# Patient Record
Sex: Female | Born: 1955 | Race: Black or African American | Hispanic: No | Marital: Single | Smoking: Former smoker
Health system: Southern US, Community
[De-identification: ages and names within clinical notes are randomized; demographics above are authoritative.]

## PROBLEM LIST (undated history)

## (undated) DIAGNOSIS — E785 Hyperlipidemia, unspecified: Secondary | ICD-10-CM

## (undated) HISTORY — PX: ABDOMINAL HYSTERECTOMY: SHX81

---

## 2020-11-13 ENCOUNTER — Encounter (HOSPITAL_COMMUNITY): Payer: Self-pay | Admitting: Internal Medicine

## 2020-11-13 ENCOUNTER — Inpatient Hospital Stay (HOSPITAL_COMMUNITY)
Admission: EM | Admit: 2020-11-13 | Discharge: 2020-11-15 | DRG: 390 | Disposition: A | Discharge status: Home / Self Care | Payer: Medicare Other | Attending: Hospitalist | Admitting: Hospitalist

## 2020-11-13 ENCOUNTER — Emergency Department (HOSPITAL_COMMUNITY): Payer: Medicare Other

## 2020-11-13 ENCOUNTER — Other Ambulatory Visit: Payer: Self-pay

## 2020-11-13 ENCOUNTER — Inpatient Hospital Stay (HOSPITAL_COMMUNITY): Payer: Medicare Other

## 2020-11-13 DIAGNOSIS — Z87891 Personal history of nicotine dependence: Secondary | ICD-10-CM

## 2020-11-13 DIAGNOSIS — I700 Atherosclerosis of aorta: Secondary | ICD-10-CM | POA: Diagnosis present

## 2020-11-13 DIAGNOSIS — Z23 Encounter for immunization: Secondary | ICD-10-CM

## 2020-11-13 DIAGNOSIS — D72829 Elevated white blood cell count, unspecified: Secondary | ICD-10-CM | POA: Diagnosis present

## 2020-11-13 DIAGNOSIS — K5730 Diverticulosis of large intestine without perforation or abscess without bleeding: Secondary | ICD-10-CM | POA: Diagnosis present

## 2020-11-13 DIAGNOSIS — E785 Hyperlipidemia, unspecified: Secondary | ICD-10-CM | POA: Diagnosis present

## 2020-11-13 DIAGNOSIS — N289 Disorder of kidney and ureter, unspecified: Secondary | ICD-10-CM | POA: Diagnosis not present

## 2020-11-13 DIAGNOSIS — I2510 Atherosclerotic heart disease of native coronary artery without angina pectoris: Secondary | ICD-10-CM | POA: Diagnosis present

## 2020-11-13 DIAGNOSIS — K56609 Unspecified intestinal obstruction, unspecified as to partial versus complete obstruction: Secondary | ICD-10-CM | POA: Diagnosis not present

## 2020-11-13 DIAGNOSIS — K565 Intestinal adhesions [bands], unspecified as to partial versus complete obstruction: Principal | ICD-10-CM | POA: Diagnosis present

## 2020-11-13 DIAGNOSIS — Z803 Family history of malignant neoplasm of breast: Secondary | ICD-10-CM

## 2020-11-13 DIAGNOSIS — Z801 Family history of malignant neoplasm of trachea, bronchus and lung: Secondary | ICD-10-CM | POA: Diagnosis not present

## 2020-11-13 DIAGNOSIS — Z79899 Other long term (current) drug therapy: Secondary | ICD-10-CM

## 2020-11-13 DIAGNOSIS — Z0189 Encounter for other specified special examinations: Secondary | ICD-10-CM

## 2020-11-13 DIAGNOSIS — R109 Unspecified abdominal pain: Secondary | ICD-10-CM | POA: Diagnosis present

## 2020-11-13 DIAGNOSIS — Z7982 Long term (current) use of aspirin: Secondary | ICD-10-CM | POA: Diagnosis not present

## 2020-11-13 DIAGNOSIS — Z800 Family history of malignant neoplasm of digestive organs: Secondary | ICD-10-CM

## 2020-11-13 DIAGNOSIS — I493 Ventricular premature depolarization: Secondary | ICD-10-CM | POA: Diagnosis present

## 2020-11-13 DIAGNOSIS — Z20822 Contact with and (suspected) exposure to covid-19: Secondary | ICD-10-CM | POA: Diagnosis present

## 2020-11-13 DIAGNOSIS — R1013 Epigastric pain: Secondary | ICD-10-CM | POA: Diagnosis present

## 2020-11-13 DIAGNOSIS — Z978 Presence of other specified devices: Secondary | ICD-10-CM

## 2020-11-13 HISTORY — DX: Hyperlipidemia, unspecified: E78.5

## 2020-11-13 LAB — COMPREHENSIVE METABOLIC PANEL
ALT: 25 U/L (ref 0–44)
AST: 29 U/L (ref 15–41)
Albumin: 3.6 g/dL (ref 3.5–5.0)
Alkaline Phosphatase: 95 U/L (ref 38–126)
Anion gap: 8 (ref 5–15)
BUN: 12 mg/dL (ref 8–23)
CO2: 24 mmol/L (ref 22–32)
Calcium: 9.4 mg/dL (ref 8.9–10.3)
Chloride: 105 mmol/L (ref 98–111)
Creatinine, Ser: 1.01 mg/dL — ABNORMAL HIGH (ref 0.44–1.00)
GFR, Estimated: >60 mL/min (ref >60)
Glucose, Bld: 130 mg/dL — ABNORMAL HIGH (ref 70–99)
Potassium: 4 mmol/L (ref 3.5–5.1)
Sodium: 137 mmol/L (ref 135–145)
Total Bilirubin: 0.6 mg/dL (ref 0.3–1.2)
Total Protein: 6.8 g/dL (ref 6.5–8.1)

## 2020-11-13 LAB — CBC WITH DIFFERENTIAL/PLATELET
Abs Immature Granulocytes: 0.04 10*3/uL (ref 0.00–0.07)
Basophils Absolute: 0 10*3/uL (ref 0.0–0.1)
Basophils Relative: 0 %
Eosinophils Absolute: 0 10*3/uL (ref 0.0–0.5)
Eosinophils Relative: 0 %
HCT: 45.5 % (ref 36.0–46.0)
Hemoglobin: 14.7 g/dL (ref 12.0–15.0)
Immature Granulocytes: 0 %
Lymphocytes Relative: 5 %
Lymphs Abs: 0.7 10*3/uL (ref 0.7–4.0)
MCH: 27.2 pg (ref 26.0–34.0)
MCHC: 32.3 g/dL (ref 30.0–36.0)
MCV: 84.3 fL (ref 80.0–100.0)
Monocytes Absolute: 0.6 10*3/uL (ref 0.1–1.0)
Monocytes Relative: 5 %
Neutro Abs: 11.2 10*3/uL — ABNORMAL HIGH (ref 1.7–7.7)
Neutrophils Relative %: 90 %
Platelets: 254 10*3/uL (ref 150–400)
RBC: 5.4 MIL/uL — ABNORMAL HIGH (ref 3.87–5.11)
RDW: 14.8 % (ref 11.5–15.5)
WBC: 12.6 10*3/uL — ABNORMAL HIGH (ref 4.0–10.5)
nRBC: 0 % (ref 0.0–0.2)

## 2020-11-13 LAB — LIPASE, BLOOD: Lipase: 32 U/L (ref 11–51)

## 2020-11-13 LAB — LACTIC ACID, PLASMA: Lactic Acid, Venous: 1.6 mmol/L (ref 0.5–1.9)

## 2020-11-13 LAB — SARS CORONAVIRUS 2 (TAT 6-24 HRS): SARS Coronavirus 2: NEGATIVE

## 2020-11-13 MED ORDER — ENOXAPARIN SODIUM 40 MG/0.4ML IJ SOSY
40.0000 mg | PREFILLED_SYRINGE | Freq: Every day | INTRAMUSCULAR | Status: DC
  Administered 2020-11-13 – 2020-11-15 (×3): 40 mg via SUBCUTANEOUS
  Filled 2020-11-13 (×3): qty 0.4

## 2020-11-13 MED ORDER — ONDANSETRON HCL 4 MG/2ML IJ SOLN: 4.0000 mg | Freq: Four times a day (QID) | INTRAMUSCULAR | Status: DC | PRN

## 2020-11-13 MED ORDER — SODIUM CHLORIDE 0.9 % IV SOLN: INTRAVENOUS | Status: DC

## 2020-11-13 MED ORDER — DIATRIZOATE MEGLUMINE & SODIUM 66-10 % PO SOLN
90.0000 mL | Freq: Once | ORAL | Status: AC
  Administered 2020-11-13: 90 mL via NASOGASTRIC
  Filled 2020-11-13: qty 90

## 2020-11-13 MED ORDER — MAGNESIUM SULFATE IN D5W 1-5 GM/100ML-% IV SOLN
1.0000 g | Freq: Once | INTRAVENOUS | Status: AC
  Administered 2020-11-13: 1 g via INTRAVENOUS
  Filled 2020-11-13: qty 100

## 2020-11-13 MED ORDER — ALBUTEROL SULFATE (2.5 MG/3ML) 0.083% IN NEBU: 2.5000 mg | INHALATION_SOLUTION | Freq: Four times a day (QID) | RESPIRATORY_TRACT | Status: DC | PRN

## 2020-11-13 MED ORDER — ACETAMINOPHEN 325 MG PO TABS: 650.0000 mg | ORAL_TABLET | Freq: Once | ORAL | Status: DC

## 2020-11-13 MED ORDER — ACETAMINOPHEN 325 MG PO TABS
650.0000 mg | ORAL_TABLET | Freq: Four times a day (QID) | ORAL | Status: DC | PRN
  Administered 2020-11-14: 650 mg via ORAL
  Filled 2020-11-13: qty 2

## 2020-11-13 MED ORDER — MORPHINE SULFATE (PF) 2 MG/ML IV SOLN
2.0000 mg | INTRAVENOUS | Status: DC | PRN
  Administered 2020-11-13 – 2020-11-14 (×4): 2 mg via INTRAVENOUS
  Filled 2020-11-13 (×4): qty 1

## 2020-11-13 MED ORDER — PANTOPRAZOLE SODIUM 40 MG IV SOLR
40.0000 mg | Freq: Every day | INTRAVENOUS | Status: DC
  Administered 2020-11-13 – 2020-11-15 (×3): 40 mg via INTRAVENOUS
  Filled 2020-11-13 (×3): qty 40

## 2020-11-13 MED ORDER — ONDANSETRON HCL 4 MG/2ML IJ SOLN
4.0000 mg | Freq: Once | INTRAMUSCULAR | Status: AC
Start: 2020-11-13 — End: 2020-11-13
  Administered 2020-11-13: 4 mg via INTRAVENOUS
  Filled 2020-11-13: qty 2

## 2020-11-13 MED ORDER — IOHEXOL 9 MG/ML PO SOLN
ORAL | Status: AC
  Filled 2020-11-13: qty 1000

## 2020-11-13 MED ORDER — HYDRALAZINE HCL 20 MG/ML IJ SOLN: 10.0000 mg | Freq: Four times a day (QID) | INTRAMUSCULAR | Status: DC | PRN

## 2020-11-13 MED ORDER — ONDANSETRON HCL 4 MG PO TABS: 4.0000 mg | ORAL_TABLET | Freq: Four times a day (QID) | ORAL | Status: DC | PRN

## 2020-11-13 MED ORDER — MORPHINE SULFATE (PF) 4 MG/ML IV SOLN
4.0000 mg | Freq: Once | INTRAVENOUS | Status: AC
  Administered 2020-11-13: 4 mg via INTRAVENOUS
  Filled 2020-11-13: qty 1

## 2020-11-13 MED ORDER — IBUPROFEN 400 MG PO TABS: 400.0000 mg | ORAL_TABLET | Freq: Once | ORAL | Status: DC

## 2020-11-13 MED ORDER — ACETAMINOPHEN 650 MG RE SUPP: 650.0000 mg | Freq: Four times a day (QID) | RECTAL | Status: DC | PRN

## 2020-11-13 MED ORDER — PHENOL 1.4 % MT LIQD
1.0000 | OROMUCOSAL | Status: DC | PRN
  Filled 2020-11-13: qty 177

## 2020-11-13 NOTE — ED Provider Notes (Signed)
[facility] EMERGENCY DEPARTMENT Provider Note   CSN: [id number] Arrival date & time: 11/13/20  0049     History Chief Complaint  Patient presents with  . Abdominal Pain    Kristy Rhodes is a 65 y.o. female.  The history is provided by the patient.  Abdominal Pain She complains of epigastric pain which started this afternoon, but got much worse tonight.  Pain is severe and she rates it at 10/10.  Pain is described as sharp.  There is associated nausea but no vomiting.  Nothing makes it better, nothing makes it worse.  Pain does tend to come in waves.  She denies constipation or diarrhea.  She denies fever or chills.  She has never had pain like this before.  She does have history of prior laparoscopy, no other abdominal surgery.   No past medical history on file.  There are no problems to display for this patient.   ** The histories are not reviewed yet. Please review them in the "History" navigator section and refresh this SmartLink.   OB History   No obstetric history on file.     No family history on file.     Home Medications Prior to Admission medications   Not on File    Allergies    Patient has no allergy information on record.  Review of Systems   Review of Systems  Gastrointestinal: Positive for abdominal pain.  All other systems reviewed and are negative.   Physical Exam Updated Vital Signs BP (!) 157/95   Pulse (!) 44   Temp 98 F (36.7 C) (Oral)   Resp 18   Ht 5\' 7"  (1.702 m)   Wt 70.3 kg   SpO2 98%   BMI 24.28 kg/m   Physical Exam Vitals and nursing note reviewed.   65 year old female, resting comfortably and in no acute distress. Vital signs are significant for slow heart rate and elevated blood pressure. Oxygen saturation is 98%, which is normal. Head is normocephalic and atraumatic. PERRLA, EOMI. Oropharynx is clear. Neck is nontender and supple without adenopathy or JVD. Back is nontender and there is no CVA  tenderness. Lungs are clear without rales, wheezes, or rhonchi. Chest is nontender. Heart has regular rate and rhythm without murmur. Abdomen is soft, flat, with moderate epigastric tenderness.  There is no rebound or guarding.  There is no right upper quadrant tenderness.  There are no masses or hepatosplenomegaly and peristalsis is hypoactive. Extremities have no cyanosis or edema, full range of motion is present. Skin is warm and dry without rash. Neurologic: Mental status is normal, cranial nerves are intact, there are no motor or sensory deficits.  ED Results / Procedures / Treatments   Labs (all labs ordered are listed, but only abnormal results are displayed) Labs Reviewed  COMPREHENSIVE METABOLIC PANEL - Abnormal; Notable for the following components:      Result Value   Glucose, Bld 130 (*)    Creatinine, Ser 1.01 (*)    All other components within normal limits  CBC WITH DIFFERENTIAL/PLATELET - Abnormal; Notable for the following components:   WBC 12.6 (*)    RBC 5.40 (*)    Neutro Abs 11.2 (*)    All other components within normal limits  SARS CORONAVIRUS 2 (TAT 6-24 HRS)  LIPASE, BLOOD  URINALYSIS, ROUTINE W REFLEX MICROSCOPIC    EKG EKG Interpretation  Date/Time:  Monday Nov 13 2020 00:49:33 EDT Ventricular Rate:  81 PR Interval:  133  QRS Duration: 83 QT Interval:  388 QTC Calculation: 451 R Axis:   37 Text Interpretation: Sinus rhythm Multiple ventricular premature complexes Consider right atrial enlargement No old tracing to compare Confirmed by Dione Booze ([id number]) on 11/13/2020 1:32:29 AM   Radiology CT ABDOMEN PELVIS WO CONTRAST  Result Date: 11/13/2020 CLINICAL DATA:  65 year old female with acute abdominal pain, pain with palpation. Nausea. EXAM: CT ABDOMEN AND PELVIS WITHOUT CONTRAST TECHNIQUE: Multidetector CT imaging of the abdomen and pelvis was performed following the standard protocol without IV contrast. COMPARISON:  None. FINDINGS: Lower chest:  Calcified coronary artery atherosclerosis (series 3, image 4). No cardiomegaly or pericardial effusion. Mildly tortuous descending thoracic aorta. Geographic ground-glass opacity in the right lower lobe on series 7, image 7. This along with patchy opacity in the posterior basal segment of the left lower lobe appears more inflammatory than related to atelectasis or scarring. There is platelike atelectasis in the right middle lobe. No pleural effusion or consolidation. Hepatobiliary: Small volume of simple perihepatic free fluid. Negative noncontrast liver. Contracted gallbladder. Pancreas: Negative noncontrast appearance. Spleen: Negative. Adrenals/Urinary Tract: Negative adrenal glands. Nonobstructed kidneys. Unremarkable urinary bladder. Occasional pelvic phleboliths. No urinary calculus identified. Stomach/Bowel: Severe diverticulosis of the large bowel throughout the abdomen and pelvis. However, no convincing large bowel inflammation. Appendix remains normal on series 3, image 59. But the stomach and small bowel are distended, with multiple dilated oral contrast containing small bowel loops which continue to the level of a fairly abrupt transition point in the left lower abdomen, suprapubic series 3, image 66 and coronal image 31. Here the distal dilated small bowel contains flocculated material and abruptly transitions to inflamed but decompressed loops in the pelvis. There is mesenteric stranding in this region and to a lesser extent in the dilated small bowel mesentery throughout the abdomen. Distal most small bowel loops have a more normal appearance and no longer appear inflamed. Small volume of free fluid is associated in both gutters. No free air. The proximal duodenum is also dilated. Vascular/Lymphatic: Extensive Aortoiliac calcified atherosclerosis. Normal caliber abdominal aorta. Vascular patency is not evaluated in the absence of IV contrast. No lymphadenopathy. Reproductive: Uterus appears absent.   Diminutive or absent ovaries. Other: Small volume simple density pelvic free fluid on series 3, image 68. Musculoskeletal: Advanced lumbar facet arthropathy. Subtle anterolisthesis of L4 on L5. Generalized abnormal bone mineralization in the right iliac wing relatively sparing the acetabulum. This abuts the right SI joint. Cortex in the affected areas appears somewhat thick and there is a heterogeneous ground-glass density to the bone. No osteolysis or associated soft tissue mass. No other suspicious osseous lesion identified. IMPRESSION: 1. Small Bowel Obstruction with transition point in the suprapubic left lower abdomen. Both the dilated loops and the decompressed loops at the transition appear inflamed. And there is a small volume of free fluid throughout the abdomen and pelvis. No free air. 2. Geographic ground-glass lung opacity in the right lower lobe suspicious for viral/atypical respiratory infection, including possibly COVID-19. Segmental collapse of the right middle lobe. No pleural fluid. 3. Abnormal bone mineralization in the right iliac wing. But benign etiology such as Fibrous Dysplasia or Paget's disease over metastatic disease or malignancy. If there are no outside prior studies for comparison then recommend follow-up outpatient nuclear Medicine whole-body bone scan to further characterize the skeleton. 4. Severe diverticulosis throughout the large bowel but no large bowel inflammation. 5. Calcified coronary artery and Aortic Atherosclerosis (ICD10-I70.0). Electronically Signed   By: Althea Grimmer.D.  On: 11/13/2020 04:52    Procedures Procedures   Medications Ordered in ED Medications  morphine 4 MG/ML injection 4 mg (has no administration in time range)  ondansetron (ZOFRAN) injection 4 mg (has no administration in time range)    ED Course  I have reviewed the triage vital signs and the nursing notes.  Pertinent labs & imaging results that were available during my care of the patient  were reviewed by me and considered in my medical decision making (see chart for details).   MDM Rules/Calculators/A&P                         Upper abdominal pain of uncertain cause.  Consider diverticulitis, peptic ulcer disease, cholecystitis, pancreatitis.  ECG shows no acute changes.  Screening labs have been ordered as well as CT of abdomen and pelvis.  She will be given morphine for pain and ondansetron for nausea.  She has no prior records in the [facility] system or in Care Everywhere.  CT scan shows small bowel obstruction.  This is presumably secondary to adhesions from her prior laparoscopy.  She got good relief of pain with morphine, but did request an additional dose which was given.  Case is discussed with Dr. Katrinka Blazing of [facility], who agrees to admit the patient.  Final Clinical Impression(s) / ED Diagnoses Final diagnoses:  Small bowel obstruction [facility])    Rx / DC Orders ED Discharge Orders    None       Dione Booze, MD 11/13/20 [phone]

## 2020-11-13 NOTE — Consult Note (Signed)
Reason for Consult:sbo Referring Physician: glick  Kristy Rhodes is an 65 y.o. female.  HPI: 70 yof who is otherwise healthy, history of prior hysterectomy and lsc, presents after having onset of abdominal pain yesterday that has worsened.  No flatus or bm since Saturday.  N/v at home and in er.  No fevers. Never happened before, came in as nothing at home was making it better.  She had ct scan that shows sbo and I was asked to see her.   pmh none psh hysterectomy, gyn lsc  Social History:  has no history on file for tobacco use, alcohol use, and drug use.  Allergies: No Known Allergies  Medications: I have reviewed the patient's current medications.  Results for orders placed or performed during the hospital encounter of 11/13/20 (from the past 48 hour(s))  Comprehensive metabolic panel     Status: Abnormal   Collection Time: 11/13/20  3:41 AM  Result Value Ref Range   Sodium 137 135 - 145 mmol/L   Potassium 4.0 3.5 - 5.1 mmol/L   Chloride 105 98 - 111 mmol/L   CO2 24 22 - 32 mmol/L   Glucose, Bld 130 (H) 70 - 99 mg/dL    Comment: Glucose reference range applies only to samples taken after fasting for at least 8 hours.   BUN 12 8 - 23 mg/dL   Creatinine, Ser 6.94 (H) 0.44 - 1.00 mg/dL   Calcium 9.4 8.9 - 85.4 mg/dL   Total Protein 6.8 6.5 - 8.1 g/dL   Albumin 3.6 3.5 - 5.0 g/dL   AST 29 15 - 41 U/L   ALT 25 0 - 44 U/L   Alkaline Phosphatase 95 38 - 126 U/L   Total Bilirubin 0.6 0.3 - 1.2 mg/dL   GFR, Estimated >62 >70 mL/min    Comment: (NOTE) Calculated using the CKD-EPI Creatinine Equation (2021)    Anion gap 8 5 - 15    Comment: Performed at [facility], [address], [place], [place] [address]  Lipase, blood     Status: None   Collection Time: 11/13/20  3:41 AM  Result Value Ref Range   Lipase 32 11 - 51 U/L    Comment: Performed at [facility], [address], [place], [place] [address]  CBC with Differential     Status: Abnormal   Collection Time:  11/13/20  3:41 AM  Result Value Ref Range   WBC 12.6 (H) 4.0 - 10.5 K/uL   RBC 5.40 (H) 3.87 - 5.11 MIL/uL   Hemoglobin 14.7 12.0 - 15.0 g/dL   HCT 99.3 71.6 - 96.7 %   MCV 84.3 80.0 - 100.0 fL   MCH 27.2 26.0 - 34.0 pg   MCHC 32.3 30.0 - 36.0 g/dL   RDW 89.3 81.0 - 17.5 %   Platelets 254 150 - 400 K/uL   nRBC 0.0 0.0 - 0.2 %   Neutrophils Relative % 90 %   Neutro Abs 11.2 (H) 1.7 - 7.7 K/uL   Lymphocytes Relative 5 %   Lymphs Abs 0.7 0.7 - 4.0 K/uL   Monocytes Relative 5 %   Monocytes Absolute 0.6 0.1 - 1.0 K/uL   Eosinophils Relative 0 %   Eosinophils Absolute 0.0 0.0 - 0.5 K/uL   Basophils Relative 0 %   Basophils Absolute 0.0 0.0 - 0.1 K/uL   Immature Granulocytes 0 %   Abs Immature Granulocytes 0.04 0.00 - 0.07 K/uL    Comment: Performed at [facility], [address], [place], [place] [address]    CT ABDOMEN PELVIS WO CONTRAST  Result Date: 11/13/2020 CLINICAL DATA:  65 year old female with acute abdominal pain, pain with palpation. Nausea. EXAM: CT ABDOMEN AND PELVIS WITHOUT CONTRAST TECHNIQUE: Multidetector CT imaging of the abdomen and pelvis was performed following the standard protocol without IV contrast. COMPARISON:  None. FINDINGS: Lower chest: Calcified coronary artery atherosclerosis (series 3, image 4). No cardiomegaly or pericardial effusion. Mildly tortuous descending thoracic aorta. Geographic ground-glass opacity in the right lower lobe on series 7, image 7. This along with patchy opacity in the posterior basal segment of the left lower lobe appears more inflammatory than related to atelectasis or scarring. There is platelike atelectasis in the right middle lobe. No pleural effusion or consolidation. Hepatobiliary: Small volume of simple perihepatic free fluid. Negative noncontrast liver. Contracted gallbladder. Pancreas: Negative noncontrast appearance. Spleen: Negative. Adrenals/Urinary Tract: Negative adrenal glands. Nonobstructed kidneys. Unremarkable  urinary bladder. Occasional pelvic phleboliths. No urinary calculus identified. Stomach/Bowel: Severe diverticulosis of the large bowel throughout the abdomen and pelvis. However, no convincing large bowel inflammation. Appendix remains normal on series 3, image 59. But the stomach and small bowel are distended, with multiple dilated oral contrast containing small bowel loops which continue to the level of a fairly abrupt transition point in the left lower abdomen, suprapubic series 3, image 66 and coronal image 31. Here the distal dilated small bowel contains flocculated material and abruptly transitions to inflamed but decompressed loops in the pelvis. There is mesenteric stranding in this region and to a lesser extent in the dilated small bowel mesentery throughout the abdomen. Distal most small bowel loops have a more normal appearance and no longer appear inflamed. Small volume of free fluid is associated in both gutters. No free air. The proximal duodenum is also dilated. Vascular/Lymphatic: Extensive Aortoiliac calcified atherosclerosis. Normal caliber abdominal aorta. Vascular patency is not evaluated in the absence of IV contrast. No lymphadenopathy. Reproductive: Uterus appears absent.  Diminutive or absent ovaries. Other: Small volume simple density pelvic free fluid on series 3, image 68. Musculoskeletal: Advanced lumbar facet arthropathy. Subtle anterolisthesis of L4 on L5. Generalized abnormal bone mineralization in the right iliac wing relatively sparing the acetabulum. This abuts the right SI joint. Cortex in the affected areas appears somewhat thick and there is a heterogeneous ground-glass density to the bone. No osteolysis or associated soft tissue mass. No other suspicious osseous lesion identified. IMPRESSION: 1. Small Bowel Obstruction with transition point in the suprapubic left lower abdomen. Both the dilated loops and the decompressed loops at the transition appear inflamed. And there is a  small volume of free fluid throughout the abdomen and pelvis. No free air. 2. Geographic ground-glass lung opacity in the right lower lobe suspicious for viral/atypical respiratory infection, including possibly COVID-19. Segmental collapse of the right middle lobe. No pleural fluid. 3. Abnormal bone mineralization in the right iliac wing. But benign etiology such as Fibrous Dysplasia or Paget's disease over metastatic disease or malignancy. If there are no outside prior studies for comparison then recommend follow-up outpatient nuclear Medicine whole-body bone scan to further characterize the skeleton. 4. Severe diverticulosis throughout the large bowel but no large bowel inflammation. 5. Calcified coronary artery and Aortic Atherosclerosis (ICD10-I70.0). Electronically Signed   By: Odessa Fleming M.D.   On: 11/13/2020 04:52    Review of Systems  Constitutional: Positive for fatigue. Negative for fever.  Respiratory: Negative for cough and shortness of breath.   Gastrointestinal:  Positive for abdominal pain, constipation, nausea and vomiting.  Genitourinary: Negative for difficulty urinating.  All other systems reviewed and are negative.  Blood pressure 114/76, pulse 70, temperature 98 F (36.7 C), temperature source Oral, resp. rate 14, height 5\' 7"  (1.702 m), weight 70.3 kg, SpO2 96 %. Physical Exam Constitutional:      General: She is not in acute distress.    Appearance: She is well-developed.  Eyes:     General: No scleral icterus.    Extraocular Movements: Extraocular movements intact.  Cardiovascular:     Rate and Rhythm: Normal rate and regular rhythm.     Heart sounds: Normal heart sounds.  Pulmonary:     Effort: Pulmonary effort is normal.     Breath sounds: Normal breath sounds.  Abdominal:     General: A surgical scar is present. There is distension (mild).     Tenderness: There is abdominal tenderness (mild periumbo and epigastric, no peritoneal signs).     Hernia: No hernia is  present.  Skin:    General: Skin is warm and dry.     Capillary Refill: Capillary refill takes less than 2 seconds.  Neurological:     General: No focal deficit present.     Mental Status: She is alert.  Psychiatric:        Mood and Affect: Mood normal.        Behavior: Behavior normal.     Assessment/Plan: SBO -appears has sbo due to adhesions -no current indication for surgery-ct does have some stranding associated and small amt free fluid but exam is very benign -will check baseline lactate -plan for sbo protocol with ng tube which obviously is necessary in her case given ct scan and exam to attempt to resolve this conservatively and do protocol -will re-evaluate later today  11/13/2020, 9:02 AM

## 2020-11-13 NOTE — ED Triage Notes (Signed)
BIB [facility] from home. Pt started having abdominal pain about 1 hour PTA. Abdominal swelling and pain with palpitation. Pt had hypertension with no history. Reports nausea and regular BMs.   200/120 70 heart rate  97%  CBG 166

## 2020-11-13 NOTE — Progress Notes (Signed)
Pt arrived to unit from ED. Peggy RN gave report. Pt's NG tube intact and connected to wall suction. Pt's niece Bonita Quin present with pt and is taking pt's belongings home with her.

## 2020-11-13 NOTE — H&P (Addendum)
History and Physical    Kristy Rhodes WUJ:[id number] DOB: [birth year] DOA: 11/13/2020  Referring MD/NP/PA: Dione Boozeavid Glick, MD PCP: Caffie DammeSmith, Karla, MD  Patient coming from: home via EMS  Chief Complaint: Abdominal pain  I have personally briefly reviewed patient's old medical records in [facility]   HPI: Kristy LefortSandra Rhodes is a 65 y.o. female with medical history significant of hyperlipidemia and remote tobacco presents with complaints of abdominal pain which started yesterday evening after she had eaten dinner.  Pain was described as severe 10/10 and located in the middle of her stomach.  Patient reported having nausea without vomiting.  Last bowel movement was yesterday.  Since coming into the hospital she has not had a bowel movement or been able to pass flatus.  Associated symptoms included  palpitations.  She felt as though she could possibly pass out and therefore called EMS.  Denies any chest pain, shortness of breath, cough, fever, chills, or dysuria.  She reports having a hysterectomy over 20 years ago.  She last had a colonoscopy the beginning of this year or last year.  ED Course: Upon admission into the emergency department patient was seen to be afebrile, blood pressures 114/76-165/98, and all other vital signs maintain.  Labs significant for WBC 12.6, BUN 12, and creatinine 1.01.  Other significant for small bowel obstruction with transition point in the suprapubic left lower abdomen, geographic groundglass lung opacity in the right lower lobe suspicious for viral/atypical respiratory infection, abnormal bone mineralization of the right iliac wing, severe diverticulosis, and calcified coronary artery with aortic atherosclerosis.  COVID-19 screening was pending.  Patient had been given morphine and Zofran.  Review of Systems  Constitutional: Positive for malaise/fatigue. Negative for fever.  HENT: Negative for congestion and nosebleeds.   Eyes: Negative for double vision and photophobia.   Respiratory: Negative for cough and shortness of breath.   Cardiovascular: Positive for palpitations. Negative for chest pain.  Gastrointestinal: Positive for abdominal pain and nausea. Negative for vomiting.  Genitourinary: Negative for dysuria and hematuria.  Musculoskeletal: Negative for falls and joint pain.  Skin: Negative for itching and rash.  Neurological: Negative for focal weakness and loss of consciousness.  Psychiatric/Behavioral: Negative for memory loss and substance abuse.    Past Medical History:  Diagnosis Date  . Hyperlipidemia     Past Surgical History:  Procedure Laterality Date  . ABDOMINAL HYSTERECTOMY       reports that she has quit smoking. She has never used smokeless tobacco. She reports previous alcohol use. She reports that she does not use drugs.  No Known Allergies  Family History  Problem Relation Age of Onset  . Breast cancer Mother   . Rectal cancer Father   . Lung cancer Sister      Current Outpatient Medications on File Prior to Encounter  Medication Sig Dispense Refill  . aspirin EC 81 MG tablet Take 81 mg by mouth daily. Swallow whole.    Marland Kitchen. atorvastatin (LIPITOR) 40 MG tablet Take 40 mg by mouth at bedtime.        Physical Exam:  Constitutional: Elderly female currently in no acute distress Vitals:   11/13/20 0600 11/13/20 0615 11/13/20 0630 11/13/20 0645  BP: 126/75 125/76 119/66 114/76  Pulse: 68  70   Resp: 14 14 14 14   Temp:      TempSrc:      SpO2: 93%  96%   Weight:      Height:       Eyes: PERRL,  lids and conjunctivae normal ENMT: Mucous membranes are moist. Posterior pharynx clear of any exudate or lesions.nasogastric tube in place. Neck: normal, supple, no masses, no thyromegaly Respiratory: clear to auscultation bilaterally, no wheezing, no crackles. Normal respiratory effort. No accessory muscle use.  Cardiovascular: Regular rate and rhythm, no murmurs / rubs / gallops. No extremity edema. 2+ pedal pulses. No  carotid bruits.  Abdomen: Mild distention with tenderness to palpation midline  and bowel sounds decreased. Musculoskeletal: no clubbing / cyanosis. No joint deformity upper and lower extremities. Good ROM, no contractures. Normal muscle tone.  Skin: no rashes, lesions, ulcers.  Healed surgical scar. Neurologic: CN 2-12 grossly intact. Sensation intact, DTR normal. Strength 5/5 in all 4.  Psychiatric: Normal judgment and insight. Alert and oriented x 3. Normal mood.     Labs on Admission: I have personally reviewed following labs and imaging studies  CBC: Recent Labs  Lab 11/13/20 0341  WBC 12.6*  NEUTROABS 11.2*  HGB 14.7  HCT 45.5  MCV 84.3  PLT 254   Basic Metabolic Panel: Recent Labs  Lab 11/13/20 0341  NA 137  K 4.0  CL 105  CO2 24  GLUCOSE 130*  BUN 12  CREATININE 1.01*  CALCIUM 9.4   GFR: Estimated Creatinine Clearance: 53.3 mL/min (A) (by C-G formula based on SCr of 1.01 mg/dL (H)). Liver Function Tests: Recent Labs  Lab 11/13/20 0341  AST 29  ALT 25  ALKPHOS 95  BILITOT 0.6  PROT 6.8  ALBUMIN 3.6   Recent Labs  Lab 11/13/20 0341  LIPASE 32   No results for input(s): AMMONIA in the last 168 hours. Coagulation Profile: No results for input(s): INR, PROTIME in the last 168 hours. Cardiac Enzymes: No results for input(s): CKTOTAL, CKMB, CKMBINDEX, TROPONINI in the last 168 hours. BNP (last 3 results) No results for input(s): PROBNP in the last 8760 hours. HbA1C: No results for input(s): HGBA1C in the last 72 hours. CBG: No results for input(s): GLUCAP in the last 168 hours. Lipid Profile: No results for input(s): CHOL, HDL, LDLCALC, TRIG, CHOLHDL, LDLDIRECT in the last 72 hours. Thyroid Function Tests: No results for input(s): TSH, T4TOTAL, FREET4, T3FREE, THYROIDAB in the last 72 hours. Anemia Panel: No results for input(s): VITAMINB12, FOLATE, FERRITIN, TIBC, IRON, RETICCTPCT in the last 72 hours. Urine analysis: No results found for:  COLORURINE, APPEARANCEUR, LABSPEC, PHURINE, GLUCOSEU, HGBUR, BILIRUBINUR, KETONESUR, PROTEINUR, UROBILINOGEN, NITRITE, LEUKOCYTESUR Sepsis Labs: No results found for this or any previous visit (from the past 240 hour(s)).   Radiological Exams on Admission: CT ABDOMEN PELVIS WO CONTRAST  Result Date: 11/13/2020 CLINICAL DATA:  65 year old female with acute abdominal pain, pain with palpation. Nausea. EXAM: CT ABDOMEN AND PELVIS WITHOUT CONTRAST TECHNIQUE: Multidetector CT imaging of the abdomen and pelvis was performed following the standard protocol without IV contrast. COMPARISON:  None. FINDINGS: Lower chest: Calcified coronary artery atherosclerosis (series 3, image 4). No cardiomegaly or pericardial effusion. Mildly tortuous descending thoracic aorta. Geographic ground-glass opacity in the right lower lobe on series 7, image 7. This along with patchy opacity in the posterior basal segment of the left lower lobe appears more inflammatory than related to atelectasis or scarring. There is platelike atelectasis in the right middle lobe. No pleural effusion or consolidation. Hepatobiliary: Small volume of simple perihepatic free fluid. Negative noncontrast liver. Contracted gallbladder. Pancreas: Negative noncontrast appearance. Spleen: Negative. Adrenals/Urinary Tract: Negative adrenal glands. Nonobstructed kidneys. Unremarkable urinary bladder. Occasional pelvic phleboliths. No urinary calculus identified. Stomach/Bowel: Severe diverticulosis of the  large bowel throughout the abdomen and pelvis. However, no convincing large bowel inflammation. Appendix remains normal on series 3, image 59. But the stomach and small bowel are distended, with multiple dilated oral contrast containing small bowel loops which continue to the level of a fairly abrupt transition point in the left lower abdomen, suprapubic series 3, image 66 and coronal image 31. Here the distal dilated small bowel contains flocculated material and  abruptly transitions to inflamed but decompressed loops in the pelvis. There is mesenteric stranding in this region and to a lesser extent in the dilated small bowel mesentery throughout the abdomen. Distal most small bowel loops have a more normal appearance and no longer appear inflamed. Small volume of free fluid is associated in both gutters. No free air. The proximal duodenum is also dilated. Vascular/Lymphatic: Extensive Aortoiliac calcified atherosclerosis. Normal caliber abdominal aorta. Vascular patency is not evaluated in the absence of IV contrast. No lymphadenopathy. Reproductive: Uterus appears absent.  Diminutive or absent ovaries. Other: Small volume simple density pelvic free fluid on series 3, image 68. Musculoskeletal: Advanced lumbar facet arthropathy. Subtle anterolisthesis of L4 on L5. Generalized abnormal bone mineralization in the right iliac wing relatively sparing the acetabulum. This abuts the right SI joint. Cortex in the affected areas appears somewhat thick and there is a heterogeneous ground-glass density to the bone. No osteolysis or associated soft tissue mass. No other suspicious osseous lesion identified. IMPRESSION: 1. Small Bowel Obstruction with transition point in the suprapubic left lower abdomen. Both the dilated loops and the decompressed loops at the transition appear inflamed. And there is a small volume of free fluid throughout the abdomen and pelvis. No free air. 2. Geographic ground-glass lung opacity in the right lower lobe suspicious for viral/atypical respiratory infection, including possibly COVID-19. Segmental collapse of the right middle lobe. No pleural fluid. 3. Abnormal bone mineralization in the right iliac wing. But benign etiology such as Fibrous Dysplasia or Paget's disease over metastatic disease or malignancy. If there are no outside prior studies for comparison then recommend follow-up outpatient nuclear Medicine whole-body bone scan to further  characterize the skeleton. 4. Severe diverticulosis throughout the large bowel but no large bowel inflammation. 5. Calcified coronary artery and Aortic Atherosclerosis (ICD10-I70.0). Electronically Signed   By: Odessa Fleming M.D.   On: 11/13/2020 04:52    EKG: Independently reviewed.  Sinus rhythm 81 bpm with multiple PVCs  Assessment/Plan Abdominal pain secondary to small bowel obstruction: Acute.  Patient presents with complaints of abdominal pain with nausea starting yesterday evening.  CT scan of the abdomen and pelvis significant for a small bowel obstruction with transition point in the suprapubic left lower abdomen.  Patient reports prior history of open hysterectomy. -Admit to medical telemetry bed -N.p.o. -Strict intake and output -Antiemetics as needed -NGT to suction -Abdominal x-ray daily -Morphine as needed for pain -Normal saline IV fluids at 75 mL/h -Appreciate general surgery consultative services we will follow for any further recommendation  Leukocytosis: Acute.  WBC elevated at 12.6.  Suspect reactive to above. -Continue to monitor   Hyperlipidemia -Held atorvastatin due to small bowel obstruction  PVCs: On admission patient was noted to have multiple PVCs. -Follow-up telemetry  Incidental findings: Groundglass opacity of the right lower lobe(COVID-19 screening was negative and patient denies any complaints of cough), abnormal bone mineralization of the right iliac wing, severe diverticulosis, and calcified coronary artery atherosclerosis. -Recommend outpatient follow-up for further work-up as needed   GI prophylaxis: Protonix IV DVT prophylaxis: Lovenox  Code Status: Full Family Communication: Niece updated over the phone Disposition Plan: Likely discharge home once medically stable Consults called: General surgery Admission status: Inpatient, require more than 2 midnight stay due to monitoring for resolution of small bowel obstruction  Clydie Braun MD [facility]   If 7PM-7AM, please contact night-coverage   11/13/2020, 7:13 AM

## 2020-11-13 NOTE — ED Notes (Signed)
Pt vomited large about yellowish liquid. Zofran given

## 2020-11-13 NOTE — ED Notes (Signed)
Called xray for abd xray for NG tube verification

## 2020-11-13 NOTE — ED Notes (Signed)
I wasn't able to get patient blood. 

## 2020-11-13 NOTE — ED Notes (Signed)
Attempted to obtain lactic acid and HIV tubes. Attempt unsuccessful.

## 2020-11-14 ENCOUNTER — Inpatient Hospital Stay (HOSPITAL_COMMUNITY): Payer: Medicare Other

## 2020-11-14 DIAGNOSIS — E785 Hyperlipidemia, unspecified: Secondary | ICD-10-CM | POA: Diagnosis not present

## 2020-11-14 DIAGNOSIS — R109 Unspecified abdominal pain: Secondary | ICD-10-CM | POA: Diagnosis not present

## 2020-11-14 DIAGNOSIS — K56609 Unspecified intestinal obstruction, unspecified as to partial versus complete obstruction: Secondary | ICD-10-CM | POA: Diagnosis not present

## 2020-11-14 DIAGNOSIS — N289 Disorder of kidney and ureter, unspecified: Secondary | ICD-10-CM

## 2020-11-14 DIAGNOSIS — D72829 Elevated white blood cell count, unspecified: Secondary | ICD-10-CM | POA: Diagnosis not present

## 2020-11-14 LAB — BASIC METABOLIC PANEL
Anion gap: 9 (ref 5–15)
BUN: 14 mg/dL (ref 8–23)
CO2: 26 mmol/L (ref 22–32)
Calcium: 9.1 mg/dL (ref 8.9–10.3)
Chloride: 109 mmol/L (ref 98–111)
Creatinine, Ser: 1.1 mg/dL — ABNORMAL HIGH (ref 0.44–1.00)
GFR, Estimated: 55 mL/min — ABNORMAL LOW (ref >60)
Glucose, Bld: 88 mg/dL (ref 70–99)
Potassium: 3.9 mmol/L (ref 3.5–5.1)
Sodium: 144 mmol/L (ref 135–145)

## 2020-11-14 LAB — HIV ANTIBODY (ROUTINE TESTING W REFLEX): HIV Screen 4th Generation wRfx: NONREACTIVE

## 2020-11-14 LAB — CBC
HCT: 45.8 % (ref 36.0–46.0)
Hemoglobin: 14.6 g/dL (ref 12.0–15.0)
MCH: 27.7 pg (ref 26.0–34.0)
MCHC: 31.9 g/dL (ref 30.0–36.0)
MCV: 86.7 fL (ref 80.0–100.0)
Platelets: 230 10*3/uL (ref 150–400)
RBC: 5.28 MIL/uL — ABNORMAL HIGH (ref 3.87–5.11)
RDW: 15.4 % (ref 11.5–15.5)
WBC: 8.3 10*3/uL (ref 4.0–10.5)
nRBC: 0 % (ref 0.0–0.2)

## 2020-11-14 LAB — MAGNESIUM: Magnesium: 2.2 mg/dL (ref 1.7–2.4)

## 2020-11-14 MED ORDER — PNEUMOCOCCAL VAC POLYVALENT 25 MCG/0.5ML IJ INJ
0.5000 mL | INJECTION | INTRAMUSCULAR | Status: AC
  Administered 2020-11-15: 0.5 mL via INTRAMUSCULAR
  Filled 2020-11-14: qty 0.5

## 2020-11-14 NOTE — Progress Notes (Addendum)
Subjective: Patient feeling better today.  No flatus or BM yet, but abdominal pain essentially resolved and doesn't feel bloated.  ROS: See above, otherwise other systems negative  Objective: Vital signs in last 24 hours: Temp:  [97.6 F (36.4 C)-98.9 F (37.2 C)] 97.6 F (36.4 C) (05/24 0719) Pulse Rate:  [36-82] 76 (05/24 0719) Resp:  [11-20] 18 (05/24 0719) BP: (127-161)/(67-98) 156/83 (05/24 0719) SpO2:  [91 %-98 %] 94 % (05/24 0719) Last BM Date: 11/12/20  Intake/Output from previous day: 05/23 0701 - 05/24 0700 In: 0  Out: 800  Intake/Output this shift: No intake/output data recorded.  PE: Gen: sitting up in her chair in NAD Abd: soft, NT, ND, +BS, NGT in place with some old bilious ouput  Lab Results:  Recent Labs    11/13/20 0341 11/14/20 0447  WBC 12.6* 8.3  HGB 14.7 14.6  HCT 45.5 45.8  PLT 254 230   BMET Recent Labs    11/13/20 0341 11/14/20 0447  NA 137 144  K 4.0 3.9  CL 105 109  CO2 24 26  GLUCOSE 130* 88  BUN 12 14  CREATININE 1.01* 1.10*  CALCIUM 9.4 9.1   PT/INR No results for input(s): LABPROT, INR in the last 72 hours. CMP     Component Value Date/Time   NA 144 11/14/2020 0447   K 3.9 11/14/2020 0447   CL 109 11/14/2020 0447   CO2 26 11/14/2020 0447   GLUCOSE 88 11/14/2020 0447   BUN 14 11/14/2020 0447   CREATININE 1.10 (H) 11/14/2020 0447   CALCIUM 9.1 11/14/2020 0447   PROT 6.8 11/13/2020 0341   ALBUMIN 3.6 11/13/2020 0341   AST 29 11/13/2020 0341   ALT 25 11/13/2020 0341   ALKPHOS 95 11/13/2020 0341   BILITOT 0.6 11/13/2020 0341   GFRNONAA 55 (L) 11/14/2020 0447   Lipase     Component Value Date/Time   LIPASE 32 11/13/2020 0341       Studies/Results: CT ABDOMEN PELVIS WO CONTRAST  Result Date: 11/13/2020 CLINICAL DATA:  65 year old female with acute abdominal pain, pain with palpation. Nausea. EXAM: CT ABDOMEN AND PELVIS WITHOUT CONTRAST TECHNIQUE: Multidetector CT imaging of the abdomen and pelvis  was performed following the standard protocol without IV contrast. COMPARISON:  None. FINDINGS: Lower chest: Calcified coronary artery atherosclerosis (series 3, image 4). No cardiomegaly or pericardial effusion. Mildly tortuous descending thoracic aorta. Geographic ground-glass opacity in the right lower lobe on series 7, image 7. This along with patchy opacity in the posterior basal segment of the left lower lobe appears more inflammatory than related to atelectasis or scarring. There is platelike atelectasis in the right middle lobe. No pleural effusion or consolidation. Hepatobiliary: Small volume of simple perihepatic free fluid. Negative noncontrast liver. Contracted gallbladder. Pancreas: Negative noncontrast appearance. Spleen: Negative. Adrenals/Urinary Tract: Negative adrenal glands. Nonobstructed kidneys. Unremarkable urinary bladder. Occasional pelvic phleboliths. No urinary calculus identified. Stomach/Bowel: Severe diverticulosis of the large bowel throughout the abdomen and pelvis. However, no convincing large bowel inflammation. Appendix remains normal on series 3, image 59. But the stomach and small bowel are distended, with multiple dilated oral contrast containing small bowel loops which continue to the level of a fairly abrupt transition point in the left lower abdomen, suprapubic series 3, image 66 and coronal image 31. Here the distal dilated small bowel contains flocculated material and abruptly transitions to inflamed but decompressed loops in the pelvis. There is mesenteric stranding in this region and to a  lesser extent in the dilated small bowel mesentery throughout the abdomen. Distal most small bowel loops have a more normal appearance and no longer appear inflamed. Small volume of free fluid is associated in both gutters. No free air. The proximal duodenum is also dilated. Vascular/Lymphatic: Extensive Aortoiliac calcified atherosclerosis. Normal caliber abdominal aorta. Vascular  patency is not evaluated in the absence of IV contrast. No lymphadenopathy. Reproductive: Uterus appears absent.  Diminutive or absent ovaries. Other: Small volume simple density pelvic free fluid on series 3, image 68. Musculoskeletal: Advanced lumbar facet arthropathy. Subtle anterolisthesis of L4 on L5. Generalized abnormal bone mineralization in the right iliac wing relatively sparing the acetabulum. This abuts the right SI joint. Cortex in the affected areas appears somewhat thick and there is a heterogeneous ground-glass density to the bone. No osteolysis or associated soft tissue mass. No other suspicious osseous lesion identified. IMPRESSION: 1. Small Bowel Obstruction with transition point in the suprapubic left lower abdomen. Both the dilated loops and the decompressed loops at the transition appear inflamed. And there is a small volume of free fluid throughout the abdomen and pelvis. No free air. 2. Geographic ground-glass lung opacity in the right lower lobe suspicious for viral/atypical respiratory infection, including possibly COVID-19. Segmental collapse of the right middle lobe. No pleural fluid. 3. Abnormal bone mineralization in the right iliac wing. But benign etiology such as Fibrous Dysplasia or Paget's disease over metastatic disease or malignancy. If there are no outside prior studies for comparison then recommend follow-up outpatient nuclear Medicine whole-body bone scan to further characterize the skeleton. 4. Severe diverticulosis throughout the large bowel but no large bowel inflammation. 5. Calcified coronary artery and Aortic Atherosclerosis (ICD10-I70.0). Electronically Signed   By: Odessa Fleming M.D.   On: 11/13/2020 04:52   DG Abd 1 View  Result Date: 11/14/2020 CLINICAL DATA:  Nasogastric tube placement EXAM: ABDOMEN - 1 VIEW COMPARISON:  11/13/2020 FINDINGS: Nasogastric tube tip noted within the expected proximal body of the stomach. The visualized abdominal gas pattern is  unremarkable. Right flank and pelvis are excluded from view. Visualized lung bases are clear. IMPRESSION: Nasogastric tube tip within the proximal body of the stomach. Electronically Signed   By: Helyn Numbers MD   On: 11/14/2020 03:05   DG Abd Portable 1V  Result Date: 11/14/2020 CLINICAL DATA:  Small-bowel obstruction. EXAM: PORTABLE ABDOMEN - 1 VIEW COMPARISON:  Prior study same day.  11/13/2020.  CT 11/13/2020. FINDINGS: NG tube in stable position. Oral contrast has progressed throughout the colon. Air-filled loops of small bowel again noted. No prominent small-bowel distention. No free air. Aortoiliac atherosclerotic vascular calcification. No acute bony abnormality. IMPRESSION: NG tube in stable position. Oral contrast has progressed throughout the colon. Air-filled loops of small bowel again noted. No prominent small-bowel distention noted. No free air. Electronically Signed   By: Maisie Fus  Register   On: 11/14/2020 07:12   DG Abd Portable 1V-Small Bowel Obstruction Protocol-initial, 8 hr delay  Result Date: 11/13/2020 CLINICAL DATA:  Small bowel obstruction. EXAM: PORTABLE ABDOMEN - 1 VIEW COMPARISON:  Radiograph and CT earlier today. FINDINGS: Enteric tube in place with tip and side-port in the stomach. There is contrast in the stomach and small bowel. Possible but not definite contrast in the ascending colon. Recommend 24 hour follow-up exam. No free intra-abdominal air. IMPRESSION: 1. Contrast in the stomach and small bowel. Possible but not definite contrast in the ascending colon. Recommend 24 hour follow-up exam. 2. Enteric tube in place. Electronically Signed  By: Narda Rutherford M.D.   On: 11/13/2020 20:13   DG Abd Portable 1V-Small Bowel Protocol-Position Verification  Result Date: 11/13/2020 CLINICAL DATA:  Status post NG tube placement EXAM: PORTABLE ABDOMEN - 1 VIEW COMPARISON:  CT AP from earlier today. FINDINGS: Interval placement of enteric tube with tip and side port below the GE  junction. Dilated small bowel loops are again noted compatible with small bowel obstruction. IMPRESSION: Interval placement of enteric tube with tip and side port below the GE junction. Electronically Signed   By: Signa Kell M.D.   On: 11/13/2020 11:40    Anti-infectives: Anti-infectives (From admission, onward)   None       Assessment/Plan SBO -repeat films this morning show contrast throughout the colon to the level of the rectum. -Dc NGt -CLD, adv to FLD as tolerates -if she tolerates this and has some bowel function, she could even go home later today vs tomorrow.   FEN - CLD, ADAT VTE - Lovenox ID - none needed   LOS: 1 day    Letha Cape , [facility] Surgery 11/14/2020, 10:38 AM Please see Amion for pager number during day hours 7:00am-4:30pm or 7:00am -11:30am on weekends

## 2020-11-14 NOTE — Progress Notes (Signed)
PROGRESS NOTE    Clayton LefortSandra Bellucci  WJX:[id number] DOB: 1956-06-12 DOA: 11/13/2020 PCP: Caffie DammeSmith, Karla, MD   Brief Narrative:  The patient is a pleasant 65 year old African-American female with a past medical history significant for but not limited to hyperlipidemia, remote tobacco abuse, history of abdominal hysterectomy as well as other comorbidities who presented with abdominal pain which started the evening before after she had eaten dinner.  She described the pain as a 10 out of 10 in severity that is located the middle of her stomach.  She reported having nausea without vomiting.  Last bowel movement was the day before yesterday.  Since coming to the hospital she has not had a bowel movement and not been able to pass any flatus.  Associated symptoms were included with palpitations.  She had a colonoscopy at the beginning of the year her last year.  Upon evaluation in the ED she is found to be afebrile and further work-up showed that she has small bowel obstruction with transition point in the suprapubic left lower abdomen as well as geographic groundglass lung opacity right lung bases suspicious for viral or atypical respiratory infection, abnormal bone mineralization of the right iliac wing, severe diverticulosis as well as calcified coronary artery with aortic atherosclerosis.  In the ED she is given IV morphine and Zofran and general surgery was consulted and placed an NG tube.  She underwent small bowel protocol and NG tube this morning was discontinued.  General surgery following and advance her diet to clear liquid diet and if she tolerates this we will go for liquid diet.  Anticipating discharge in the next 24 to 48 hours  Assessment & Plan:   Principal Problem:   SBO (small bowel obstruction) (HCC) Active Problems:   Abdominal pain   Hyperlipidemia   Leukocytosis  Abdominal Pain 2/2 to SBO -Acute. She presented with complaints of Nausea and Abd Pain the night before -CT Scan of the Abd  shows "Small Bowel Obstruction with transition point in the suprapubic left lower abdomen. Both the dilated loops and the decompressed loops at the transition appear inflamed. And there is a small volume of free fluid throughout the abdomen and pelvis. No free air. Geographic ground-glass lung opacity in the right lower lobe suspicious for viral/atypical respiratory infection, including possibly COVID-19. Segmental collapse of the right middle lobe. No pleural fluid. Abnormal bone mineralization in the right iliac wing. But benign etiology such as Fibrous Dysplasia or Paget's disease over metastatic disease or malignancy. If there are no outside prior studies for comparison then recommend follow-up outpatient nuclear Medicine whole-body bone scan to further characterize the skeleton.  Severe diverticulosis throughout the large bowel but no large bowel inflammation. Calcified coronary artery and Aortic Atherosclerosis" -She has a Surgical Hx of Open Hysterectomy -Admitted to Medical Telemetry and had NG Placed -NPO but Surgery evaluated and repeated films and showed Contrast throughout the colon to the level of Rectum -Surgery recommending D/C NGT and placing patient on CLD and advancing to FLD and ADAT -Strict I's and O's and Daily Weights -Repeat KUB in the AM  -Appreciate General Surgery Recc's  -If Able to tolerate a Soft Diet can D/C Home  -C/w Ambulation -Ensure Electrolytes are replete and Ensure K+>4.0 and Mag >2.0 -Continue with IV fluid hydration with normal saline at 75 mils per hour -Continue with supportive care and continue with PPI IV daily as well as IV morphine 2 mg every 2 as needed for severe pain, as well as antiemetics with  ondansetron 4 mg p.o./IV every 6 hours.  Nausea  Leukocytosis -WBC was 12.6 on Admission and likely Reactive -Now improved to 8.3 -Continue to Monitor and Trend -Repeat CBC in the AM   Renal Insuffiencey -Patient's BUN/Cr went from 12/1.01 ->  14/1.10 -Avoid Nephrotoxic Medications, Contrast Dyes, Hypotension, and Renally Adjust Medications -Continue to Monitor and Trend and Repeat CMP within in AM   Hyperlipidemia -Held Atorvastatin due to small bowel obstruction but resume when tolerating a better diet  PVCs -On admission patient was noted to have multiple PVCs. -C/w Follow-up telemetry  Incidental CT findings -Groundglass opacity of the right lower lobe(COVID-19 screening was negative and patient denies any complaints of cough), abnormal bone mineralization of the right iliac wing, severe diverticulosis, and calcified coronary artery atherosclerosis. -Recommend outpatient follow-up for further work-up as needed  DVT prophylaxis: Enoxaparin 40 mg sq Daily Code Status: FULL CODE Family Communication: No family present at bedside Disposition Plan: Pending further clinical Improvement and clearance by General Surgery  Status is: Inpatient  Remains inpatient appropriate because:Unsafe d/c plan, IV treatments appropriate due to intensity of illness or inability to take PO and Inpatient level of care appropriate due to severity of illness   Dispo: The patient is from: Home              Anticipated d/c is to: Home              Patient currently is not medically stable to d/c.   Difficult to place patient No   Consultants:   General Surgery   Procedures: None  Antimicrobials:  Anti-infectives (From admission, onward)   None       Subjective: Seen and examined at bedside and she is doing much better now and denies any abdominal pain.  Her NG tube had just been removed and she was about to eat her critical diet.  No lightheadedness or dizziness.  No other concerns or complaints at this time.  Objective: Vitals:   11/13/20 2001 11/14/20 0010 11/14/20 0410 11/14/20 0719  BP: (!) 150/98 (!) 155/87 (!) 161/85 (!) 156/83  Pulse: 79 82 82 76  Resp: 17 18 15 18   Temp: 98.3 F (36.8 C) 98.1 F (36.7 C) 98.6 F (37  C) 97.6 F (36.4 C)  TempSrc: Oral Oral Oral Oral  SpO2: 93% 91% 92% 94%  Weight:      Height:        Intake/Output Summary (Last 24 hours) at 11/14/2020 0810 Last data filed at 11/13/2020 2200 Gross per 24 hour  Intake 0 ml  Output 800 ml  Net -800 ml   Filed Weights   11/13/20 0102  Weight: 70.3 kg   Examination: Physical Exam:  Constitutional: WN/WD AAF in NAD and appears calm and comfortable Eyes: Lids and conjunctivae normal, sclerae anicteric  ENMT: External Ears, Nose appear normal. Grossly normal hearing.  Neck: Appears normal, supple, no cervical masses, normal ROM, no appreciable thyromegaly; no JVD Respiratory: Diminished to auscultation bilaterally, no wheezing, rales, rhonchi or crackles. Normal respiratory effort and patient is not tachypenic. No accessory muscle use. Unlabored breathing  Cardiovascular: RRR, no murmurs / rubs / gallops. S1 and S2 auscultated. No extremity edema.  Abdomen: Soft, non-tender, non-distended. Bowel sounds positive.  GU: Deferred. Musculoskeletal: No clubbing / cyanosis of digits/nails. No joint deformity upper and lower extremities.  Skin: No rashes, lesions, ulcers on a limited skin evaluation. No induration; Warm and dry.  Neurologic: CN 2-12 grossly intact with no focal deficits. Romberg  sign and cerebellar reflexes not assessed.  Psychiatric: Normal judgment and insight. Alert and oriented x 3. Normal mood and appropriate affect.   Data Reviewed: I have personally reviewed following labs and imaging studies  CBC: Recent Labs  Lab 11/13/20 0341 11/14/20 0447  WBC 12.6* 8.3  NEUTROABS 11.2*  --   HGB 14.7 14.6  HCT 45.5 45.8  MCV 84.3 86.7  PLT 254 230   Basic Metabolic Panel: Recent Labs  Lab 11/13/20 0341 11/14/20 0447  NA 137 144  K 4.0 3.9  CL 105 109  CO2 24 26  GLUCOSE 130* 88  BUN 12 14  CREATININE 1.01* 1.10*  CALCIUM 9.4 9.1  MG  --  2.2   GFR: Estimated Creatinine Clearance: 48.9 mL/min (A) (by C-G  formula based on SCr of 1.1 mg/dL (H)). Liver Function Tests: Recent Labs  Lab 11/13/20 0341  AST 29  ALT 25  ALKPHOS 95  BILITOT 0.6  PROT 6.8  ALBUMIN 3.6   Recent Labs  Lab 11/13/20 0341  LIPASE 32   No results for input(s): AMMONIA in the last 168 hours. Coagulation Profile: No results for input(s): INR, PROTIME in the last 168 hours. Cardiac Enzymes: No results for input(s): CKTOTAL, CKMB, CKMBINDEX, TROPONINI in the last 168 hours. BNP (last 3 results) No results for input(s): PROBNP in the last 8760 hours. HbA1C: No results for input(s): HGBA1C in the last 72 hours. CBG: No results for input(s): GLUCAP in the last 168 hours. Lipid Profile: No results for input(s): CHOL, HDL, LDLCALC, TRIG, CHOLHDL, LDLDIRECT in the last 72 hours. Thyroid Function Tests: No results for input(s): TSH, T4TOTAL, FREET4, T3FREE, THYROIDAB in the last 72 hours. Anemia Panel: No results for input(s): VITAMINB12, FOLATE, FERRITIN, TIBC, IRON, RETICCTPCT in the last 72 hours. Sepsis Labs: Recent Labs  Lab 11/13/20 1311  LATICACIDVEN 1.6    Recent Results (from the past 240 hour(s))  SARS CORONAVIRUS 2 (TAT 6-24 HRS) Nasopharyngeal Nasopharyngeal Swab     Status: None   Collection Time: 11/13/20  6:32 AM   Specimen: Nasopharyngeal Swab  Result Value Ref Range Status   SARS Coronavirus 2 NEGATIVE NEGATIVE Final    Comment: (NOTE) SARS-CoV-2 target nucleic acids are NOT DETECTED.  The SARS-CoV-2 RNA is generally detectable in upper and lower respiratory specimens during the acute phase of infection. Negative results do not preclude SARS-CoV-2 infection, do not rule out co-infections with other pathogens, and should not be used as the sole basis for treatment or other patient management decisions. Negative results must be combined with clinical observations, patient history, and epidemiological information. The expected result is Negative.  Fact Sheet for  Patients: HairSlick.no  Fact Sheet for Healthcare Providers: quierodirigir.com  This test is not yet approved or cleared by the [place] FDA and  has been authorized for detection and/or diagnosis of SARS-CoV-2 by FDA under an Emergency Use Authorization (EUA). This EUA will remain  in effect (meaning this test can be used) for the duration of the COVID-19 declaration under Se ction 564(b)(1) of the Act, 21 U.S.C. section 360bbb-3(b)(1), unless the authorization is terminated or revoked sooner.  Performed at [facility], [address], [place], [place] [address]     RN Pressure Injury Documentation:     Estimated body mass index is 24.28 kg/m as calculated from the following:   Height as of this encounter: 5\' 7"  (1.702 m).   Weight as of this encounter: 70.3 kg.  Malnutrition Type:  Malnutrition Characteristics:   Nutrition Interventions:    Radiology Studies: CT ABDOMEN PELVIS WO CONTRAST  Result Date: 11/13/2020 CLINICAL DATA:  65 year old female with acute abdominal pain, pain with palpation. Nausea. EXAM: CT ABDOMEN AND PELVIS WITHOUT CONTRAST TECHNIQUE: Multidetector CT imaging of the abdomen and pelvis was performed following the standard protocol without IV contrast. COMPARISON:  None. FINDINGS: Lower chest: Calcified coronary artery atherosclerosis (series 3, image 4). No cardiomegaly or pericardial effusion. Mildly tortuous descending thoracic aorta. Geographic ground-glass opacity in the right lower lobe on series 7, image 7. This along with patchy opacity in the posterior basal segment of the left lower lobe appears more inflammatory than related to atelectasis or scarring. There is platelike atelectasis in the right middle lobe. No pleural effusion or consolidation. Hepatobiliary: Small volume of simple perihepatic free fluid. Negative noncontrast liver. Contracted gallbladder. Pancreas: Negative  noncontrast appearance. Spleen: Negative. Adrenals/Urinary Tract: Negative adrenal glands. Nonobstructed kidneys. Unremarkable urinary bladder. Occasional pelvic phleboliths. No urinary calculus identified. Stomach/Bowel: Severe diverticulosis of the large bowel throughout the abdomen and pelvis. However, no convincing large bowel inflammation. Appendix remains normal on series 3, image 59. But the stomach and small bowel are distended, with multiple dilated oral contrast containing small bowel loops which continue to the level of a fairly abrupt transition point in the left lower abdomen, suprapubic series 3, image 66 and coronal image 31. Here the distal dilated small bowel contains flocculated material and abruptly transitions to inflamed but decompressed loops in the pelvis. There is mesenteric stranding in this region and to a lesser extent in the dilated small bowel mesentery throughout the abdomen. Distal most small bowel loops have a more normal appearance and no longer appear inflamed. Small volume of free fluid is associated in both gutters. No free air. The proximal duodenum is also dilated. Vascular/Lymphatic: Extensive Aortoiliac calcified atherosclerosis. Normal caliber abdominal aorta. Vascular patency is not evaluated in the absence of IV contrast. No lymphadenopathy. Reproductive: Uterus appears absent.  Diminutive or absent ovaries. Other: Small volume simple density pelvic free fluid on series 3, image 68. Musculoskeletal: Advanced lumbar facet arthropathy. Subtle anterolisthesis of L4 on L5. Generalized abnormal bone mineralization in the right iliac wing relatively sparing the acetabulum. This abuts the right SI joint. Cortex in the affected areas appears somewhat thick and there is a heterogeneous ground-glass density to the bone. No osteolysis or associated soft tissue mass. No other suspicious osseous lesion identified. IMPRESSION: 1. Small Bowel Obstruction with transition point in the  suprapubic left lower abdomen. Both the dilated loops and the decompressed loops at the transition appear inflamed. And there is a small volume of free fluid throughout the abdomen and pelvis. No free air. 2. Geographic ground-glass lung opacity in the right lower lobe suspicious for viral/atypical respiratory infection, including possibly COVID-19. Segmental collapse of the right middle lobe. No pleural fluid. 3. Abnormal bone mineralization in the right iliac wing. But benign etiology such as Fibrous Dysplasia or Paget's disease over metastatic disease or malignancy. If there are no outside prior studies for comparison then recommend follow-up outpatient nuclear Medicine whole-body bone scan to further characterize the skeleton. 4. Severe diverticulosis throughout the large bowel but no large bowel inflammation. 5. Calcified coronary artery and Aortic Atherosclerosis (ICD10-I70.0). Electronically Signed   By: Odessa Fleming M.D.   On: 11/13/2020 04:52   DG Abd 1 View  Result Date: 11/14/2020 CLINICAL DATA:  Nasogastric tube placement EXAM: ABDOMEN - 1 VIEW COMPARISON:  11/13/2020 FINDINGS: Nasogastric tube tip  noted within the expected proximal body of the stomach. The visualized abdominal gas pattern is unremarkable. Right flank and pelvis are excluded from view. Visualized lung bases are clear. IMPRESSION: Nasogastric tube tip within the proximal body of the stomach. Electronically Signed   By: Helyn Numbers MD   On: 11/14/2020 03:05   DG Abd Portable 1V  Result Date: 11/14/2020 CLINICAL DATA:  Small-bowel obstruction. EXAM: PORTABLE ABDOMEN - 1 VIEW COMPARISON:  Prior study same day.  11/13/2020.  CT 11/13/2020. FINDINGS: NG tube in stable position. Oral contrast has progressed throughout the colon. Air-filled loops of small bowel again noted. No prominent small-bowel distention. No free air. Aortoiliac atherosclerotic vascular calcification. No acute bony abnormality. IMPRESSION: NG tube in stable position.  Oral contrast has progressed throughout the colon. Air-filled loops of small bowel again noted. No prominent small-bowel distention noted. No free air. Electronically Signed   By: Maisie Fus  Register   On: 11/14/2020 07:12   DG Abd Portable 1V-Small Bowel Obstruction Protocol-initial, 8 hr delay  Result Date: 11/13/2020 CLINICAL DATA:  Small bowel obstruction. EXAM: PORTABLE ABDOMEN - 1 VIEW COMPARISON:  Radiograph and CT earlier today. FINDINGS: Enteric tube in place with tip and side-port in the stomach. There is contrast in the stomach and small bowel. Possible but not definite contrast in the ascending colon. Recommend 24 hour follow-up exam. No free intra-abdominal air. IMPRESSION: 1. Contrast in the stomach and small bowel. Possible but not definite contrast in the ascending colon. Recommend 24 hour follow-up exam. 2. Enteric tube in place. Electronically Signed   By: Narda Rutherford M.D.   On: 11/13/2020 20:13   DG Abd Portable 1V-Small Bowel Protocol-Position Verification  Result Date: 11/13/2020 CLINICAL DATA:  Status post NG tube placement EXAM: PORTABLE ABDOMEN - 1 VIEW COMPARISON:  CT AP from earlier today. FINDINGS: Interval placement of enteric tube with tip and side port below the GE junction. Dilated small bowel loops are again noted compatible with small bowel obstruction. IMPRESSION: Interval placement of enteric tube with tip and side port below the GE junction. Electronically Signed   By: Signa Kell M.D.   On: 11/13/2020 11:40   Scheduled Meds: . enoxaparin (LOVENOX) injection  40 mg Subcutaneous Daily  . pantoprazole (PROTONIX) IV  40 mg Intravenous Daily   Continuous Infusions: . sodium chloride 75 mL/hr at 11/13/20 1045     LOS: 1 day    Merlene Laughter, DO [facility] PAGER is on AMION  If 7PM-7AM, please contact night-coverage [email or web address]

## 2020-11-15 ENCOUNTER — Inpatient Hospital Stay (HOSPITAL_COMMUNITY): Payer: Medicare Other

## 2020-11-15 LAB — CBC WITH DIFFERENTIAL/PLATELET
Abs Immature Granulocytes: 0.02 10*3/uL (ref 0.00–0.07)
Basophils Absolute: 0 10*3/uL (ref 0.0–0.1)
Basophils Relative: 0 %
Eosinophils Absolute: 0.2 10*3/uL (ref 0.0–0.5)
Eosinophils Relative: 3 %
HCT: 39.3 % (ref 36.0–46.0)
Hemoglobin: 12.9 g/dL (ref 12.0–15.0)
Immature Granulocytes: 0 %
Lymphocytes Relative: 14 %
Lymphs Abs: 1.1 10*3/uL (ref 0.7–4.0)
MCH: 27.7 pg (ref 26.0–34.0)
MCHC: 32.8 g/dL (ref 30.0–36.0)
MCV: 84.3 fL (ref 80.0–100.0)
Monocytes Absolute: 0.7 10*3/uL (ref 0.1–1.0)
Monocytes Relative: 9 %
Neutro Abs: 5.8 10*3/uL (ref 1.7–7.7)
Neutrophils Relative %: 74 %
Platelets: 230 10*3/uL (ref 150–400)
RBC: 4.66 MIL/uL (ref 3.87–5.11)
RDW: 15 % (ref 11.5–15.5)
WBC: 7.9 10*3/uL (ref 4.0–10.5)
nRBC: 0 % (ref 0.0–0.2)

## 2020-11-15 LAB — COMPREHENSIVE METABOLIC PANEL
ALT: 19 U/L (ref 0–44)
AST: 21 U/L (ref 15–41)
Albumin: 3.1 g/dL — ABNORMAL LOW (ref 3.5–5.0)
Alkaline Phosphatase: 74 U/L (ref 38–126)
Anion gap: 9 (ref 5–15)
BUN: 9 mg/dL (ref 8–23)
CO2: 24 mmol/L (ref 22–32)
Calcium: 8.6 mg/dL — ABNORMAL LOW (ref 8.9–10.3)
Chloride: 107 mmol/L (ref 98–111)
Creatinine, Ser: 0.89 mg/dL (ref 0.44–1.00)
GFR, Estimated: >60 mL/min (ref >60)
Glucose, Bld: 88 mg/dL (ref 70–99)
Potassium: 3.4 mmol/L — ABNORMAL LOW (ref 3.5–5.1)
Sodium: 140 mmol/L (ref 135–145)
Total Bilirubin: 0.8 mg/dL (ref 0.3–1.2)
Total Protein: 6.1 g/dL — ABNORMAL LOW (ref 6.5–8.1)

## 2020-11-15 LAB — MAGNESIUM: Magnesium: 1.9 mg/dL (ref 1.7–2.4)

## 2020-11-15 LAB — PHOSPHORUS: Phosphorus: 3.1 mg/dL (ref 2.5–4.6)

## 2020-11-15 MED ORDER — PNEUMOCOCCAL VAC POLYVALENT 25 MCG/0.5ML IJ INJ: 0.5000 mL | INJECTION | Freq: Once | INTRAMUSCULAR | 0 refills | Status: AC

## 2020-11-15 MED ORDER — POTASSIUM CHLORIDE CRYS ER 20 MEQ PO TBCR
40.0000 meq | EXTENDED_RELEASE_TABLET | Freq: Once | ORAL | Status: AC
  Administered 2020-11-15: 40 meq via ORAL
  Filled 2020-11-15: qty 2

## 2020-11-15 NOTE — Discharge Summary (Signed)
Physician Discharge Summary   Miray Mancino  female DOB: 01-05-1956  OZH:[id number]  PCP: Caffie Damme, MD  Admit date: 11/13/2020 Discharge date: 11/15/2020  Admitted From: home Disposition:  home CODE STATUS: Full code   Hospital Course:  For full details, please see H&P, progress notes, consult notes and ancillary notes.  Briefly,  Rylin Saez is a  65 year old African-American female with a past medical history significant for but not limited to hyperlipidemia, remote tobacco abuse, history of abdominal hysterectomy as well as other comorbidities who presented with abdominal pain which started the evening before after she had eaten dinner.    Upon evaluation in the ED she was found to be afebrile and further work-up showed that she had small bowel obstruction with transition point in the suprapubic left lower abdomen as well as geographic groundglass lung opacity right lung bases suspicious for viral or atypical respiratory infection, abnormal bone mineralization of the right iliac wing, severe diverticulosis as well as calcified coronary artery with aortic atherosclerosis.    Abdominal Pain 2/2 to SBO NG Placed and GunSurg consulted.  Repeat imaging showed Contrast throughout the colon to the level of rectum.  NG tube was therefore removed, and pt placed on clear liquid diet.  Pt then had BM's of normal consistency, and tolerated soft diet, and was cleared for discharge by surgery.  Leukocytosis, resolved -WBC was 12.6 on Admission and likely Reactive  Hyperlipidemia --statin resumed after pt tolerated diet.  Incidental CT findings -Groundglass opacity of the right lower lobe (COVID-19 screening was negativeand patient denies any complaints of cough), abnormal bone mineralization of the right iliac wing, severe diverticulosis, and calcified coronary artery atherosclerosis. -Recommend outpatient follow-up for further work-up as needed   Discharge Diagnoses:  Principal  Problem:   SBO (small bowel obstruction) (HCC) Active Problems:   Abdominal pain   Hyperlipidemia   Leukocytosis   30 Day Unplanned Readmission Risk Score   Flowsheet Row ED to Hosp-Admission (Current) from 11/13/2020 in [facility]  30 Day Unplanned Readmission Risk Score (%) 10.38 Filed at 11/15/2020 0801     This score is the patient's risk of an unplanned readmission within 30 days of being discharged (0 -100%). The score is based on dignosis, age, lab data, medications, orders, and past utilization.   Low:  0-14.9   Medium: 15-21.9   High: 22-29.9   Extreme: 30 and above        Discharge Instructions:  Allergies as of 11/15/2020   No Known Allergies     Medication List    TAKE these medications   aspirin EC 81 MG tablet Take 81 mg by mouth daily. Swallow whole.   atorvastatin 40 MG tablet Commonly known as: LIPITOR Take 40 mg by mouth at bedtime.   pneumococcal 23 valent vaccine 25 MCG/0.5ML injection Commonly known as: PNEUMOVAX-23 Inject 0.5 mLs into the muscle once for 1 dose.        Follow-up Information    Caffie Damme, MD. Schedule an appointment as soon as possible for a visit in 1 week(s).   Specialty: Family Medicine Contact information: [address] [place] [address] [phone]               No Known Allergies   The results of significant diagnostics from this hospitalization (including imaging, microbiology, ancillary and laboratory) are listed below for reference.   Consultations:   Procedures/Studies: CT ABDOMEN PELVIS WO CONTRAST  Result Date: 11/13/2020 CLINICAL DATA:  65 year old female with acute abdominal pain, pain with palpation. Nausea. EXAM: CT ABDOMEN AND PELVIS WITHOUT CONTRAST TECHNIQUE: Multidetector CT imaging of the abdomen and pelvis was performed following the standard protocol without IV contrast. COMPARISON:  None. FINDINGS: Lower chest: Calcified coronary artery atherosclerosis  (series 3, image 4). No cardiomegaly or pericardial effusion. Mildly tortuous descending thoracic aorta. Geographic ground-glass opacity in the right lower lobe on series 7, image 7. This along with patchy opacity in the posterior basal segment of the left lower lobe appears more inflammatory than related to atelectasis or scarring. There is platelike atelectasis in the right middle lobe. No pleural effusion or consolidation. Hepatobiliary: Small volume of simple perihepatic free fluid. Negative noncontrast liver. Contracted gallbladder. Pancreas: Negative noncontrast appearance. Spleen: Negative. Adrenals/Urinary Tract: Negative adrenal glands. Nonobstructed kidneys. Unremarkable urinary bladder. Occasional pelvic phleboliths. No urinary calculus identified. Stomach/Bowel: Severe diverticulosis of the large bowel throughout the abdomen and pelvis. However, no convincing large bowel inflammation. Appendix remains normal on series 3, image 59. But the stomach and small bowel are distended, with multiple dilated oral contrast containing small bowel loops which continue to the level of a fairly abrupt transition point in the left lower abdomen, suprapubic series 3, image 66 and coronal image 31. Here the distal dilated small bowel contains flocculated material and abruptly transitions to inflamed but decompressed loops in the pelvis. There is mesenteric stranding in this region and to a lesser extent in the dilated small bowel mesentery throughout the abdomen. Distal most small bowel loops have a more normal appearance and no longer appear inflamed. Small volume of free fluid is associated in both gutters. No free air. The proximal duodenum is also dilated. Vascular/Lymphatic: Extensive Aortoiliac calcified atherosclerosis. Normal caliber abdominal aorta. Vascular patency is not evaluated in the absence of IV contrast. No lymphadenopathy. Reproductive: Uterus appears absent.  Diminutive or absent ovaries. Other: Small  volume simple density pelvic free fluid on series 3, image 68. Musculoskeletal: Advanced lumbar facet arthropathy. Subtle anterolisthesis of L4 on L5. Generalized abnormal bone mineralization in the right iliac wing relatively sparing the acetabulum. This abuts the right SI joint. Cortex in the affected areas appears somewhat thick and there is a heterogeneous ground-glass density to the bone. No osteolysis or associated soft tissue mass. No other suspicious osseous lesion identified. IMPRESSION: 1. Small Bowel Obstruction with transition point in the suprapubic left lower abdomen. Both the dilated loops and the decompressed loops at the transition appear inflamed. And there is a small volume of free fluid throughout the abdomen and pelvis. No free air. 2. Geographic ground-glass lung opacity in the right lower lobe suspicious for viral/atypical respiratory infection, including possibly COVID-19. Segmental collapse of the right middle lobe. No pleural fluid. 3. Abnormal bone mineralization in the right iliac wing. But benign etiology such as Fibrous Dysplasia or Paget's disease over metastatic disease or malignancy. If there are no outside prior studies for comparison then recommend follow-up outpatient nuclear Medicine whole-body bone scan to further characterize the skeleton. 4. Severe diverticulosis throughout the large bowel but no large bowel inflammation. 5. Calcified coronary artery and Aortic Atherosclerosis (ICD10-I70.0). Electronically Signed   By: Odessa Fleming M.D.   On: 11/13/2020 04:52   DG Abd 1 View  Result Date: 11/15/2020 CLINICAL DATA:  Small-bowel obstruction EXAM: ABDOMEN - 1 VIEW COMPARISON:  11/14/2020 FINDINGS: Normal abdominal gas pattern. Oral contrast has passed into the colon and rectal vault. Scattered diverticulosis is noted of the distal colon, more severe within the sigmoid  colon, not well assessed on this examination. No gross free intraperitoneal gas. No organomegaly. IMPRESSION:  Normal abdominal gas pattern. Electronically Signed   By: Helyn NumbersAshesh  Parikh MD   On: 11/15/2020 06:55   DG Abd 1 View  Result Date: 11/14/2020 CLINICAL DATA:  Nasogastric tube placement EXAM: ABDOMEN - 1 VIEW COMPARISON:  11/13/2020 FINDINGS: Nasogastric tube tip noted within the expected proximal body of the stomach. The visualized abdominal gas pattern is unremarkable. Right flank and pelvis are excluded from view. Visualized lung bases are clear. IMPRESSION: Nasogastric tube tip within the proximal body of the stomach. Electronically Signed   By: Helyn NumbersAshesh  Parikh MD   On: 11/14/2020 03:05   DG Abd Portable 1V  Result Date: 11/14/2020 CLINICAL DATA:  Small-bowel obstruction. EXAM: PORTABLE ABDOMEN - 1 VIEW COMPARISON:  Prior study same day.  11/13/2020.  CT 11/13/2020. FINDINGS: NG tube in stable position. Oral contrast has progressed throughout the colon. Air-filled loops of small bowel again noted. No prominent small-bowel distention. No free air. Aortoiliac atherosclerotic vascular calcification. No acute bony abnormality. IMPRESSION: NG tube in stable position. Oral contrast has progressed throughout the colon. Air-filled loops of small bowel again noted. No prominent small-bowel distention noted. No free air. Electronically Signed   By: Maisie Fushomas  Register   On: 11/14/2020 07:12   DG Abd Portable 1V-Small Bowel Obstruction Protocol-initial, 8 hr delay  Result Date: 11/13/2020 CLINICAL DATA:  Small bowel obstruction. EXAM: PORTABLE ABDOMEN - 1 VIEW COMPARISON:  Radiograph and CT earlier today. FINDINGS: Enteric tube in place with tip and side-port in the stomach. There is contrast in the stomach and small bowel. Possible but not definite contrast in the ascending colon. Recommend 24 hour follow-up exam. No free intra-abdominal air. IMPRESSION: 1. Contrast in the stomach and small bowel. Possible but not definite contrast in the ascending colon. Recommend 24 hour follow-up exam. 2. Enteric tube in place.  Electronically Signed   By: Narda RutherfordMelanie  Sanford M.D.   On: 11/13/2020 20:13   DG Abd Portable 1V-Small Bowel Protocol-Position Verification  Result Date: 11/13/2020 CLINICAL DATA:  Status post NG tube placement EXAM: PORTABLE ABDOMEN - 1 VIEW COMPARISON:  CT AP from earlier today. FINDINGS: Interval placement of enteric tube with tip and side port below the GE junction. Dilated small bowel loops are again noted compatible with small bowel obstruction. IMPRESSION: Interval placement of enteric tube with tip and side port below the GE junction. Electronically Signed   By: Signa Kellaylor  Stroud M.D.   On: 11/13/2020 11:40      Labs: BNP (last 3 results) No results for input(s): BNP in the last 8760 hours. Basic Metabolic Panel: Recent Labs  Lab 11/13/20 0341 11/14/20 0447 11/15/20 0053  NA 137 144 140  K 4.0 3.9 3.4*  CL 105 109 107  CO2 24 26 24   GLUCOSE 130* 88 88  BUN 12 14 9   CREATININE 1.01* 1.10* 0.89  CALCIUM 9.4 9.1 8.6*  MG  --  2.2 1.9  PHOS  --   --  3.1   Liver Function Tests: Recent Labs  Lab 11/13/20 0341 11/15/20 0053  AST 29 21  ALT 25 19  ALKPHOS 95 74  BILITOT 0.6 0.8  PROT 6.8 6.1*  ALBUMIN 3.6 3.1*   Recent Labs  Lab 11/13/20 0341  LIPASE 32   No results for input(s): AMMONIA in the last 168 hours. CBC: Recent Labs  Lab 11/13/20 0341 11/14/20 0447 11/15/20 0053  WBC 12.6* 8.3 7.9  NEUTROABS 11.2*  --  5.8  HGB 14.7 14.6 12.9  HCT 45.5 45.8 39.3  MCV 84.3 86.7 84.3  PLT 254 230 230   Cardiac Enzymes: No results for input(s): CKTOTAL, CKMB, CKMBINDEX, TROPONINI in the last 168 hours. BNP: Invalid input(s): POCBNP CBG: No results for input(s): GLUCAP in the last 168 hours. D-Dimer No results for input(s): DDIMER in the last 72 hours. Hgb A1c No results for input(s): HGBA1C in the last 72 hours. Lipid Profile No results for input(s): CHOL, HDL, LDLCALC, TRIG, CHOLHDL, LDLDIRECT in the last 72 hours. Thyroid function studies No results for  input(s): TSH, T4TOTAL, T3FREE, THYROIDAB in the last 72 hours.  Invalid input(s): FREET3 Anemia work up No results for input(s): VITAMINB12, FOLATE, FERRITIN, TIBC, IRON, RETICCTPCT in the last 72 hours. Urinalysis No results found for: COLORURINE, APPEARANCEUR, LABSPEC, PHURINE, GLUCOSEU, HGBUR, BILIRUBINUR, KETONESUR, PROTEINUR, UROBILINOGEN, NITRITE, LEUKOCYTESUR Sepsis Labs Invalid input(s): PROCALCITONIN,  WBC,  LACTICIDVEN Microbiology Recent Results (from the past 240 hour(s))  SARS CORONAVIRUS 2 (TAT 6-24 HRS) Nasopharyngeal Nasopharyngeal Swab     Status: None   Collection Time: 11/13/20  6:32 AM   Specimen: Nasopharyngeal Swab  Result Value Ref Range Status   SARS Coronavirus 2 NEGATIVE NEGATIVE Final    Comment: (NOTE) SARS-CoV-2 target nucleic acids are NOT DETECTED.  The SARS-CoV-2 RNA is generally detectable in upper and lower respiratory specimens during the acute phase of infection. Negative results do not preclude SARS-CoV-2 infection, do not rule out co-infections with other pathogens, and should not be used as the sole basis for treatment or other patient management decisions. Negative results must be combined with clinical observations, patient history, and epidemiological information. The expected result is Negative.  Fact Sheet for Patients: HairSlick.no  Fact Sheet for Healthcare Providers: quierodirigir.com  This test is not yet approved or cleared by the [place] FDA and  has been authorized for detection and/or diagnosis of SARS-CoV-2 by FDA under an Emergency Use Authorization (EUA). This EUA will remain  in effect (meaning this test can be used) for the duration of the COVID-19 declaration under Se ction 564(b)(1) of the Act, 21 U.S.C. section 360bbb-3(b)(1), unless the authorization is terminated or revoked sooner.  Performed at [facility], [address], [place],  [place] [address]      Total time spend on discharging this patient, including the last patient exam, discussing the hospital stay, instructions for ongoing care as it relates to all pertinent caregivers, as well as preparing the medical discharge records, prescriptions, and/or referrals as applicable, is 35 minutes.    Darlin Priestly, MD  [facility] 11/15/2020, 10:49 AM

## 2020-11-15 NOTE — Progress Notes (Signed)
Progress Note     Subjective: Patient tolerating FLD without nausea and having bowel function. She denies abdominal pain.   Objective: Vital signs in last 24 hours: Temp:  [97.6 F (36.4 C)-98.5 F (36.9 C)] 98.2 F (36.8 C) (05/25 0855) Pulse Rate:  [67-90] 76 (05/25 0855) Resp:  [12-19] 19 (05/25 0855) BP: (142-178)/(75-94) 143/94 (05/25 0855) SpO2:  [94 %-98 %] 94 % (05/25 0855) Last BM Date: 11/14/20  Intake/Output from previous day: 05/24 0701 - 05/25 0700 In: 120 [P.O.:120] Out: -  Intake/Output this shift: No intake/output data recorded.  PE: General: pleasant, WD, WN female who is laying in bed in NAD Heart: regular, rate, and rhythm. Lungs:  Respiratory effort nonlabored Abd: soft, NT, ND, +BS MS: all 4 extremities are symmetrical with no cyanosis, clubbing, or edema. Skin: warm and dry with no masses, lesions, or rashes Psych: A&Ox3 with an appropriate affect.    Lab Results:  Recent Labs    11/14/20 0447 11/15/20 0053  WBC 8.3 7.9  HGB 14.6 12.9  HCT 45.8 39.3  PLT 230 230   BMET Recent Labs    11/14/20 0447 11/15/20 0053  NA 144 140  K 3.9 3.4*  CL 109 107  CO2 26 24  GLUCOSE 88 88  BUN 14 9  CREATININE 1.10* 0.89  CALCIUM 9.1 8.6*   PT/INR No results for input(s): LABPROT, INR in the last 72 hours. CMP     Component Value Date/Time   NA 140 11/15/2020 0053   K 3.4 (L) 11/15/2020 0053   CL 107 11/15/2020 0053   CO2 24 11/15/2020 0053   GLUCOSE 88 11/15/2020 0053   BUN 9 11/15/2020 0053   CREATININE 0.89 11/15/2020 0053   CALCIUM 8.6 (L) 11/15/2020 0053   PROT 6.1 (L) 11/15/2020 0053   ALBUMIN 3.1 (L) 11/15/2020 0053   AST 21 11/15/2020 0053   ALT 19 11/15/2020 0053   ALKPHOS 74 11/15/2020 0053   BILITOT 0.8 11/15/2020 0053   GFRNONAA >60 11/15/2020 0053   Lipase     Component Value Date/Time   LIPASE 32 11/13/2020 0341       Studies/Results: DG Abd 1 View  Result Date: 11/15/2020 CLINICAL DATA:  Small-bowel  obstruction EXAM: ABDOMEN - 1 VIEW COMPARISON:  11/14/2020 FINDINGS: Normal abdominal gas pattern. Oral contrast has passed into the colon and rectal vault. Scattered diverticulosis is noted of the distal colon, more severe within the sigmoid colon, not well assessed on this examination. No gross free intraperitoneal gas. No organomegaly. IMPRESSION: Normal abdominal gas pattern. Electronically Signed   By: Helyn Numbers MD   On: 11/15/2020 06:55   DG Abd 1 View  Result Date: 11/14/2020 CLINICAL DATA:  Nasogastric tube placement EXAM: ABDOMEN - 1 VIEW COMPARISON:  11/13/2020 FINDINGS: Nasogastric tube tip noted within the expected proximal body of the stomach. The visualized abdominal gas pattern is unremarkable. Right flank and pelvis are excluded from view. Visualized lung bases are clear. IMPRESSION: Nasogastric tube tip within the proximal body of the stomach. Electronically Signed   By: Helyn Numbers MD   On: 11/14/2020 03:05   DG Abd Portable 1V  Result Date: 11/14/2020 CLINICAL DATA:  Small-bowel obstruction. EXAM: PORTABLE ABDOMEN - 1 VIEW COMPARISON:  Prior study same day.  11/13/2020.  CT 11/13/2020. FINDINGS: NG tube in stable position. Oral contrast has progressed throughout the colon. Air-filled loops of small bowel again noted. No prominent small-bowel distention. No free air. Aortoiliac atherosclerotic vascular calcification. No acute  bony abnormality. IMPRESSION: NG tube in stable position. Oral contrast has progressed throughout the colon. Air-filled loops of small bowel again noted. No prominent small-bowel distention noted. No free air. Electronically Signed   By: Maisie Fus  Register   On: 11/14/2020 07:12   DG Abd Portable 1V-Small Bowel Obstruction Protocol-initial, 8 hr delay  Result Date: 11/13/2020 CLINICAL DATA:  Small bowel obstruction. EXAM: PORTABLE ABDOMEN - 1 VIEW COMPARISON:  Radiograph and CT earlier today. FINDINGS: Enteric tube in place with tip and side-port in the  stomach. There is contrast in the stomach and small bowel. Possible but not definite contrast in the ascending colon. Recommend 24 hour follow-up exam. No free intra-abdominal air. IMPRESSION: 1. Contrast in the stomach and small bowel. Possible but not definite contrast in the ascending colon. Recommend 24 hour follow-up exam. 2. Enteric tube in place. Electronically Signed   By: Narda Rutherford M.D.   On: 11/13/2020 20:13   DG Abd Portable 1V-Small Bowel Protocol-Position Verification  Result Date: 11/13/2020 CLINICAL DATA:  Status post NG tube placement EXAM: PORTABLE ABDOMEN - 1 VIEW COMPARISON:  CT AP from earlier today. FINDINGS: Interval placement of enteric tube with tip and side port below the GE junction. Dilated small bowel loops are again noted compatible with small bowel obstruction. IMPRESSION: Interval placement of enteric tube with tip and side port below the GE junction. Electronically Signed   By: Signa Kell M.D.   On: 11/13/2020 11:40    Anti-infectives: Anti-infectives (From admission, onward)   None       Assessment/Plan SBO - repeat films 5/24 show contrast throughout the colon to the level of the rectum. - tolerating FLD and having bowel function - recommend advancing to soft diet and discharge home later today if tolerating  FEN - soft diet  VTE - Lovenox ID - none needed  LOS: 2 days    Juliet Rude, [facility] 11/15/2020, 9:16 AM Please see Amion for pager number during day hours 7:00am-4:30pm

## 2020-11-15 NOTE — Progress Notes (Signed)
Kristy Rhodes to be D/C'd Home per MD order.  Discussed with the patient and all questions fully answered.  VSS, Skin clean, dry and intact without evidence of skin break down, no evidence of skin tears noted. IV catheter discontinued intact. Site without signs and symptoms of complications. Dressing and pressure applied.  An After Visit Summary was printed and given to the patient.  D/c education completed with patient/family including follow up instructions, medication list, d/c activities limitations if indicated, with other d/c instructions as indicated by MD - patient able to verbalize understanding, all questions fully answered.   Patient instructed to return to ED, call 911, or call MD for any changes in condition.   Patient escorted via WC, and D/C home via private auto.  Lelend Heinecke T Wayde Gopaul 11/15/2020 1:28 PM

## 2021-11-08 ENCOUNTER — Emergency Department (HOSPITAL_BASED_OUTPATIENT_CLINIC_OR_DEPARTMENT_OTHER): Payer: Medicare PPO

## 2021-11-08 ENCOUNTER — Other Ambulatory Visit: Payer: Self-pay

## 2021-11-08 ENCOUNTER — Emergency Department (HOSPITAL_BASED_OUTPATIENT_CLINIC_OR_DEPARTMENT_OTHER)
Admission: EM | Admit: 2021-11-08 | Discharge: 2021-11-09 | Disposition: A | Discharge status: Home / Self Care | Payer: Medicare PPO | Attending: Emergency Medicine | Admitting: Emergency Medicine

## 2021-11-08 ENCOUNTER — Encounter (HOSPITAL_BASED_OUTPATIENT_CLINIC_OR_DEPARTMENT_OTHER): Payer: Self-pay

## 2021-11-08 DIAGNOSIS — I10 Essential (primary) hypertension: Secondary | ICD-10-CM | POA: Insufficient documentation

## 2021-11-08 DIAGNOSIS — R079 Chest pain, unspecified: Secondary | ICD-10-CM

## 2021-11-08 DIAGNOSIS — R0789 Other chest pain: Secondary | ICD-10-CM | POA: Diagnosis present

## 2021-11-08 DIAGNOSIS — Z7982 Long term (current) use of aspirin: Secondary | ICD-10-CM | POA: Insufficient documentation

## 2021-11-08 LAB — HEPATIC FUNCTION PANEL
ALT: 24 U/L (ref 0–44)
AST: 27 U/L (ref 15–41)
Albumin: 4.2 g/dL (ref 3.5–5.0)
Alkaline Phosphatase: 112 U/L (ref 38–126)
Bilirubin, Direct: <0.1 mg/dL (ref 0.0–0.2)
Total Bilirubin: 0.5 mg/dL (ref 0.3–1.2)
Total Protein: 7.8 g/dL (ref 6.5–8.1)

## 2021-11-08 LAB — BASIC METABOLIC PANEL
Anion gap: 6 (ref 5–15)
BUN: 10 mg/dL (ref 8–23)
CO2: 24 mmol/L (ref 22–32)
Calcium: 9.1 mg/dL (ref 8.9–10.3)
Chloride: 109 mmol/L (ref 98–111)
Creatinine, Ser: 1.03 mg/dL — ABNORMAL HIGH (ref 0.44–1.00)
GFR, Estimated: 60 mL/min — ABNORMAL LOW (ref >60)
Glucose, Bld: 91 mg/dL (ref 70–99)
Potassium: 3.9 mmol/L (ref 3.5–5.1)
Sodium: 139 mmol/L (ref 135–145)

## 2021-11-08 LAB — TROPONIN I (HIGH SENSITIVITY)
Troponin I (High Sensitivity): 6 ng/L (ref <18)
Troponin I (High Sensitivity): 7 ng/L (ref <18)

## 2021-11-08 LAB — CBC
HCT: 42.7 % (ref 36.0–46.0)
Hemoglobin: 13.9 g/dL (ref 12.0–15.0)
MCH: 27.7 pg (ref 26.0–34.0)
MCHC: 32.6 g/dL (ref 30.0–36.0)
MCV: 85.1 fL (ref 80.0–100.0)
Platelets: 256 10*3/uL (ref 150–400)
RBC: 5.02 MIL/uL (ref 3.87–5.11)
RDW: 14.3 % (ref 11.5–15.5)
WBC: 7.9 10*3/uL (ref 4.0–10.5)
nRBC: 0 % (ref 0.0–0.2)

## 2021-11-08 LAB — LIPASE, BLOOD: Lipase: 34 U/L (ref 11–51)

## 2021-11-08 MED ORDER — IOHEXOL 350 MG/ML SOLN
100.0000 mL | Freq: Once | INTRAVENOUS | Status: AC | PRN
Start: 2021-11-08 — End: 2021-11-08
  Administered 2021-11-08: 100 mL via INTRAVENOUS

## 2021-11-08 MED ORDER — KETOROLAC TROMETHAMINE 15 MG/ML IJ SOLN
15.0000 mg | Freq: Once | INTRAMUSCULAR | Status: AC
  Administered 2021-11-08: 15 mg via INTRAVENOUS
  Filled 2021-11-08: qty 1

## 2021-11-08 MED ORDER — NITROGLYCERIN 0.4 MG SL SUBL
0.4000 mg | SUBLINGUAL_TABLET | SUBLINGUAL | Status: DC | PRN
  Administered 2021-11-08 (×2): 0.4 mg via SUBLINGUAL
  Filled 2021-11-08: qty 1

## 2021-11-08 NOTE — ED Notes (Signed)
Pt no longer having ventricular trigeminy on the monitor at this time.

## 2021-11-08 NOTE — ED Notes (Signed)
Pt ambulatory to restroom with standby assist

## 2021-11-08 NOTE — ED Provider Notes (Addendum)
[facility] Provider Note   CSN: [id number] Arrival date & time: 11/08/21  1841     History  Chief Complaint  Patient presents with   Chest Pain    Kristy Rhodes is a 66 y.o. female who presents emergency department complaining of chest pain.  Patient states that while she was at work, she started to get some left-sided chest discomfort, while they were "moving pieces around".  She states this was around 1 PM, she describes the pain as "a toothache", is unable to elaborate further.  States she has never had pain like this before.  No shortness of breath, nausea, vomiting, lightheadedness, headache, or diaphoresis.  She went to her primary doctor, and they obtained a chest x-ray and encouraged her to go to the ER.  She was feeling well leading up until today.   Chest Pain Associated symptoms: no abdominal pain, no back pain, no diaphoresis, no fever, no headache, no nausea, no palpitations, no shortness of breath, no vomiting and no weakness    HPI: A 66 year old patient with a history of hypertension and hypercholesterolemia presents for evaluation of chest pain. Initial onset of pain was more than 6 hours ago. The patient's chest pain is well-localized and is not worse with exertion. The patient's chest pain is middle- or left-sided, is not described as heaviness/pressure/tightness, is not sharp and does not radiate to the arms/jaw/neck. The patient does not complain of nausea and denies diaphoresis. The patient has no history of stroke, has no history of peripheral artery disease, has not smoked in the past 90 days, denies any history of treated diabetes, has no relevant family history of coronary artery disease (first degree relative at less than age 81) and does not have an elevated BMI (>=30).   Home Medications Prior to Admission medications   Medication Sig Start Date End Date Taking? Authorizing Provider  aspirin EC 81 MG tablet Take 81 mg by mouth  daily. Swallow whole.    [provider]  atorvastatin (LIPITOR) 40 MG tablet Take 40 mg by mouth at bedtime. 10/19/20   [provider]  cyclobenzaprine (FLEXERIL) 10 MG tablet Take 10 mg by mouth at bedtime as needed. 09/20/21   [provider]      Allergies    Patient has no known allergies.    Review of Systems   Review of Systems  Constitutional:  Negative for diaphoresis and fever.  Respiratory:  Negative for shortness of breath.   Cardiovascular:  Positive for chest pain. Negative for palpitations and leg swelling.  Gastrointestinal:  Negative for abdominal pain, nausea and vomiting.  Musculoskeletal:  Negative for back pain.  Neurological:  Negative for weakness, light-headedness and headaches.  All other systems reviewed and are negative.  Physical Exam Updated Vital Signs BP (!) 181/88 (BP Location: Right Arm)   Pulse 69   Temp 98 F (36.7 C) (Oral)   Resp 17   Ht 5\' 7"  (1.702 m)   Wt 72.6 kg   SpO2 97%   BMI 25.06 kg/m  Physical Exam Vitals and nursing note reviewed.  Constitutional:      Appearance: Normal appearance.  HENT:     Head: Normocephalic and atraumatic.  Eyes:     Conjunctiva/sclera: Conjunctivae normal.  Cardiovascular:     Rate and Rhythm: Normal rate and regular rhythm.     Pulses:          Radial pulses are 2+ on the right side and 2+ on  the left side.       Posterior tibial pulses are 2+ on the right side and 2+ on the left side.  Pulmonary:     Effort: Pulmonary effort is normal. No respiratory distress.     Breath sounds: Normal breath sounds.  Chest:    Abdominal:     General: There is no distension.     Palpations: Abdomen is soft.     Tenderness: There is no abdominal tenderness.  Musculoskeletal:     Right lower leg: No edema.     Left lower leg: No edema.  Skin:    General: Skin is warm and dry.  Neurological:     General: No focal deficit present.     Mental Status: She is alert.    ED Results  / Procedures / Treatments   Labs (all labs ordered are listed, but only abnormal results are displayed) Labs Reviewed  BASIC METABOLIC PANEL - Abnormal; Notable for the following components:      Result Value   Creatinine, Ser 1.03 (*)    GFR, Estimated 60 (*)    All other components within normal limits  CBC  HEPATIC FUNCTION PANEL  LIPASE, BLOOD  TROPONIN I (HIGH SENSITIVITY)  TROPONIN I (HIGH SENSITIVITY)    EKG EKG Interpretation  Date/Time:  Thursday Nov 08 2021 18:50:02 EDT Ventricular Rate:  80 PR Interval:  130 QRS Duration: 82 QT Interval:  378 QTC Calculation: 435 R Axis:   68 Text Interpretation: Normal sinus rhythm Right atrial enlargement Cannot rule out Anterior infarct , age undetermined Abnormal ECG When compared with ECG of 13-Nov-2020 00:49, Nonspecific TW changes Confirmed by Gareth Morgan [phone]) on 11/08/2021 9:54:04 PM  Radiology DG Chest 2 View  Result Date: 11/08/2021 CLINICAL DATA:  Chest pain EXAM: CHEST - 2 VIEW COMPARISON:  None Available. FINDINGS: Cardiac and mediastinal contours are within normal limits. No focal pulmonary opacity. No pleural effusion or pneumothorax. No acute osseous abnormality. IMPRESSION: No acute cardiopulmonary process. Electronically Signed   By: Merilyn Baba M.D.   On: 11/08/2021 19:20   CT Angio Chest/Abd/Pel for Dissection W and/or Wo Contrast  Result Date: 11/08/2021 CLINICAL DATA:  Sudden onset left-sided chest pain EXAM: CT ANGIOGRAPHY CHEST, ABDOMEN AND PELVIS TECHNIQUE: Non-contrast CT of the chest was initially obtained. Multidetector CT imaging through the chest, abdomen and pelvis was performed using the standard protocol during bolus administration of intravenous contrast. Multiplanar reconstructed images and MIPs were obtained and reviewed to evaluate the vascular anatomy. RADIATION DOSE REDUCTION: This exam was performed according to the departmental dose-optimization program which includes automated exposure  control, adjustment of the mA and/or kV according to patient size and/or use of iterative reconstruction technique. CONTRAST:  146mL OMNIPAQUE IOHEXOL 350 MG/ML SOLN COMPARISON:  11/08/2021, 11/13/2020 FINDINGS: CTA CHEST FINDINGS Cardiovascular: There is ectasia of the thoracic aorta without aneurysm or dissection. Atherosclerosis of the aortic arch and proximal descending thoracic aorta. The heart is unremarkable. Stable atherosclerosis within the LAD distribution of the coronary vasculature. There is technically adequate opacification of the pulmonary vasculature. No filling defects or pulmonary emboli. Mediastinum/Nodes: No enlarged mediastinal, hilar, or axillary lymph nodes. Thyroid gland, trachea, and esophagus demonstrate no significant findings. Lungs/Pleura: Upper lobe predominant emphysema. Linear opacities within the right middle and bilateral lower lobes consistent with chronic scarring. No acute airspace disease, effusion, or pneumothorax. Subpleural curvilinear density within the right lower lobe image 59/6 likely reflects postinflammatory scarring. Musculoskeletal: No acute or destructive bony lesions. Reconstructed images demonstrate  no additional findings. Review of the MIP images confirms the above findings. CTA ABDOMEN AND PELVIS FINDINGS VASCULAR Aorta: Normal caliber aorta without aneurysm, dissection, vasculitis or significant stenosis. Diffuse atherosclerosis. Celiac: Patent without evidence of aneurysm, dissection, vasculitis or significant stenosis. Mild atherosclerosis at the origin. SMA: Patent without evidence of aneurysm, dissection, vasculitis or significant stenosis. Replaced right hepatic artery off the SMA, a frequent anatomic variant. Renals: There is stable atherosclerosis at the origin of the bilateral renal arteries, estimated less than 50% stenosis on the right and 50-70% stenosis on the left. No aneurysm, dissection, vasculitis, or evidence of fibromuscular dysplasia. IMA:  Patent without evidence of aneurysm, dissection, vasculitis or significant stenosis. Inflow: Patent without evidence of aneurysm, dissection, vasculitis or significant stenosis. Diffuse atherosclerosis. Veins: No obvious venous abnormality within the limitations of this arterial phase study. Review of the MIP images confirms the above findings. NON-VASCULAR Hepatobiliary: No focal liver abnormality is seen. No gallstones, gallbladder wall thickening, or biliary dilatation. Pancreas: Unremarkable. No pancreatic ductal dilatation or surrounding inflammatory changes. Spleen: Normal in size without focal abnormality. Adrenals/Urinary Tract: Stable left renal cyst. No imaging follow-up is required. Otherwise the kidneys enhance normally and symmetrically. No urinary tract calculi or obstructive uropathy. The adrenals are grossly normal. Stomach/Bowel: No bowel obstruction or ileus. Normal appendix right lower quadrant. Diffuse diverticulosis of the descending and sigmoid colon without diverticulitis. No bowel wall thickening or inflammatory change. Lymphatic: No pathologic adenopathy within the abdomen or pelvis. Reproductive: Status post hysterectomy. No adnexal masses. Other: No free fluid or free intraperitoneal gas. No abdominal wall hernia. Musculoskeletal: No acute or destructive bony lesions. Trabecular and cortical thickening throughout the right iliac bone most consistent with Paget disease, stable. Reconstructed images demonstrate no additional findings. Review of the MIP images confirms the above findings. IMPRESSION: 1. No evidence of thoracic aortic aneurysm or dissection. 2. No evidence of pulmonary embolus. 3. Prominent atherosclerosis of the renal arteries, with less than 50% stenosis on the right and 50-70% stenosis on the left. 4. Aortic Atherosclerosis (ICD10-I70.0) and Emphysema (ICD10-J43.9). 5. Likely area of postinflammatory subpleural scarring in the right lower lobe, at the site of previous  airspace disease. No acute intrathoracic process. 6. Distal colonic diverticulosis without diverticulitis. 7. Trabecular and cortical thickening throughout the right iliac bone, unchanged, most consistent with Paget disease. 8. Stable coronary artery atherosclerosis. Electronically Signed   By: Randa Ngo M.D.   On: 11/08/2021 22:50    Procedures Procedures    Medications Ordered in ED Medications  nitroGLYCERIN (NITROSTAT) SL tablet 0.4 mg (0.4 mg Sublingual Given 11/08/21 2213)  iohexol (OMNIPAQUE) 350 MG/ML injection 100 mL (100 mLs Intravenous Contrast Given 11/08/21 2217)  ketorolac (TORADOL) 15 MG/ML injection 15 mg (15 mg Intravenous Given 11/08/21 2346)    ED Course/ Medical Decision Making/ A&P   HEAR Score: 3                       Medical Decision Making Amount and/or Complexity of Data Reviewed Labs: ordered. Radiology: ordered.  Risk Prescription drug management.   This patient is a 66 y.o. female who presents to the ED for concern of chest pain, this involves an extensive number of treatment options, and is a complaint that carries with it a high risk of complications and morbidity. The emergent differential diagnosis prior to evaluation includes, but is not limited to,  ACS, pericarditis, aortic dissection, PE, pneumothorax, esophageal spasm or rupture, chronic angina, valvular disease, cardiomyopathy, myocarditis, pulmonary HTN,  pneumonia, bronchitis, GERD, reflux/PUD, biliary disease, pancreatitis, disk disease, costochondritis, anxiety or panic attack.  This is not an exhaustive differential.   Past Medical History / Co-morbidities / Social History: Hyperlipidemia  Additional history: Chart reviewed. Pertinent results include: No documented history of hypertension  Physical Exam: Physical exam performed. The pertinent findings include: Reproducible tenderness palpation of left-sided anterior chest wall.  Patient continues to be persistently hypertensive, with some  improvement after nitroglycerin. Lung sounds clear. No peripheral edema and normal pulses in all extremities.   Lab Tests: I ordered, and personally interpreted labs.  The pertinent results include: No leukocytosis, normal hemoglobin.  Electrolytes within normal limits.  Normal lipase.  Normal hepatic function. Initial troponin of 6, delta troponin of 7.   Imaging Studies: I ordered imaging studies including chest x-ray and CT angio to rule out dissection. I independently visualized and interpreted imaging which showed no acute cardiopulmonary abnormalities, no evidence of dissection. I agree with the radiologist interpretation.   Cardiac Monitoring:  The patient was maintained on a cardiac monitor.  My attending physician Dr. Billy Fischer viewed and interpreted the cardiac monitored which showed an underlying rhythm of: normal sinus rhythm. I agree with this interpretation.    Medications: I ordered medication including nitroglycerin and Toradol for chest pain. Reevaluation of the patient after these medicines showed that the patient improved. I have reviewed the patients home medicines and have made adjustments as needed.  Disposition: After consideration of the diagnostic results and the patients response to treatment, I feel that patient is to be discharged with recommendation to follow up with PCP in regards to today's hospital visit. Chest pain is not likely of cardiac or pulmonary etiology d/t presentation, Well's criteria for PE negative, VSS, no tracheal deviation, no JVD or new murmur, RRR, breath sounds equal bilaterally, EKG without acute abnormalities, negative troponin, and negative CXR. Heart score of 3. Pt has been advised to return to the ED if CP becomes exertional, associated with diaphoresis or nausea, radiates to left jaw/arm, worsens or becomes concerning in any way. Pt appears reliable for follow up and is agreeable to discharge.  She continued to be hypertensive, questionable if  this was related to her pain or not.  She plans to follow-up with her PCP tomorrow, and I encouraged that she likely be started on blood pressure medication.  I discussed this case with my attending physician Dr. Billy Fischer who cosigned this note including patient's presenting symptoms, physical exam, and planned diagnostics and interventions. Attending physician stated agreement with plan or made changes to plan which were implemented.    Final Clinical Impression(s) / ED Diagnoses Final diagnoses:  Nonspecific chest pain    Rx / DC Orders ED Discharge Orders     None      Portions of this report may have been transcribed using voice recognition software. Every effort was made to ensure accuracy; however, inadvertent computerized transcription errors may be present.    Estill Cotta 11/08/21 2359    Gareth Morgan, MD 11/09/21 [phone]

## 2021-11-08 NOTE — Discharge Instructions (Addendum)
You were seen in the emergency department for chest pain.  As we discussed we have thoroughly evaluated your heart and your lungs, and I do not think the pain is coming from this.  Your lab work has looked reassuring and so did your imaging.  Your blood pressure has continued to be elevated.  I think that she likely need to be started on medication for your blood pressure.  I recommend giving your primary doctor a call in the morning, and requesting ER follow-up visit.  Continue to monitor how you're doing and return to the ER for new or worsening symptoms.

## 2021-11-08 NOTE — ED Notes (Signed)
Pt to CT with this RN to monitor 

## 2021-11-08 NOTE — ED Triage Notes (Signed)
Patient c/o left sided sudden chest "discomfort" X 1pm today. Denies shortness of breath or n/v.

## 2021-11-09 NOTE — ED Notes (Signed)
Pt A&OX4 ambulatory at d/c with independent gait but wheeled out of ED via wheelchair. Pt verbalized understanding of d/c instructions and follow up care.

## 2022-01-25 ENCOUNTER — Other Ambulatory Visit (HOSPITAL_COMMUNITY): Payer: Self-pay | Admitting: Family Medicine

## 2022-01-25 DIAGNOSIS — R079 Chest pain, unspecified: Secondary | ICD-10-CM

## 2022-02-11 ENCOUNTER — Telehealth (HOSPITAL_COMMUNITY): Payer: Self-pay | Admitting: Emergency Medicine

## 2022-02-11 DIAGNOSIS — R079 Chest pain, unspecified: Secondary | ICD-10-CM

## 2022-02-11 MED ORDER — METOPROLOL TARTRATE 100 MG PO TABS: 100.0000 mg | ORAL_TABLET | Freq: Once | ORAL | 0 refills | Status: AC

## 2022-02-11 NOTE — Telephone Encounter (Signed)
Reaching out to patient to offer assistance regarding upcoming cardiac imaging study; pt verbalizes understanding of appt date/time, parking situation and where to check in, pre-test NPO status and medications ordered, and verified current allergies; name and call back number provided for further questions should they arise Rockwell Alexandria RN Navigator Cardiac Imaging [facility] [phone] office [phone] cell  Difficult IV 100mg  metoprolol Arrival 100 w/c entrance

## 2022-02-11 NOTE — Telephone Encounter (Signed)
Attempted to call patient regarding upcoming cardiac CT appointment. °Left message on voicemail with name and callback number °Toiya Morrish RN Navigator Cardiac Imaging °[facility] °[phone] Office °[phone] Cell ° °

## 2022-02-12 ENCOUNTER — Ambulatory Visit (HOSPITAL_COMMUNITY)
Admission: RE | Admit: 2022-02-12 | Discharge: 2022-02-12 | Disposition: A | Discharge status: Home / Self Care | Payer: Medicare PPO | Source: Ambulatory Visit | Attending: Family Medicine | Admitting: Family Medicine

## 2022-02-12 DIAGNOSIS — I2510 Atherosclerotic heart disease of native coronary artery without angina pectoris: Secondary | ICD-10-CM | POA: Diagnosis not present

## 2022-02-12 DIAGNOSIS — R079 Chest pain, unspecified: Secondary | ICD-10-CM | POA: Insufficient documentation

## 2022-02-12 MED ORDER — IOHEXOL 350 MG/ML SOLN
100.0000 mL | Freq: Once | INTRAVENOUS | Status: AC | PRN
  Administered 2022-02-12: 100 mL via INTRAVENOUS

## 2022-02-12 MED ORDER — NITROGLYCERIN 0.4 MG SL SUBL
0.8000 mg | SUBLINGUAL_TABLET | Freq: Once | SUBLINGUAL | Status: AC
  Administered 2022-02-12: 0.8 mg via SUBLINGUAL

## 2022-12-14 IMAGING — CT CT ANGIO CHEST-ABD-PELV FOR DISSECTION W/ AND WO/W CM
2 of 9 series · 13 of 46 positions shown, 15 images · IV contrast (Omnipaque)
Comparison: 11/08/2021, 11/13/2020

CLINICAL DATA: Sudden onset left-sided chest pain

EXAM:
CT ANGIOGRAPHY CHEST, ABDOMEN AND PELVIS
TECHNIQUE: Non-contrast CT of the chest was initially obtained.

[Series 6: axial arterial · axial · arterial · 0.94mm/px · z∈[+716,+1256]mm · 10 of 202 slices shown, 12 images]
[im 11/202  soft-tissue]
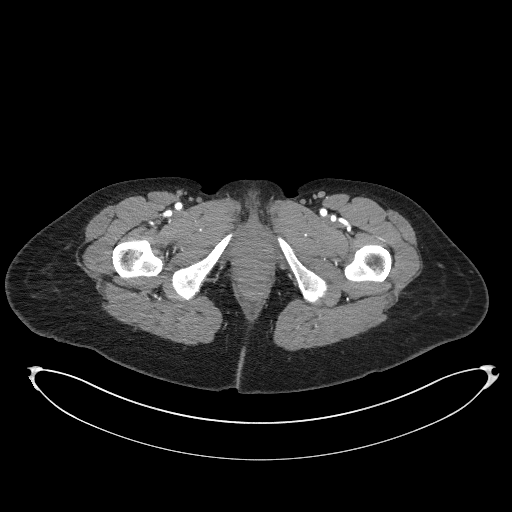
[im 11/202  bone]
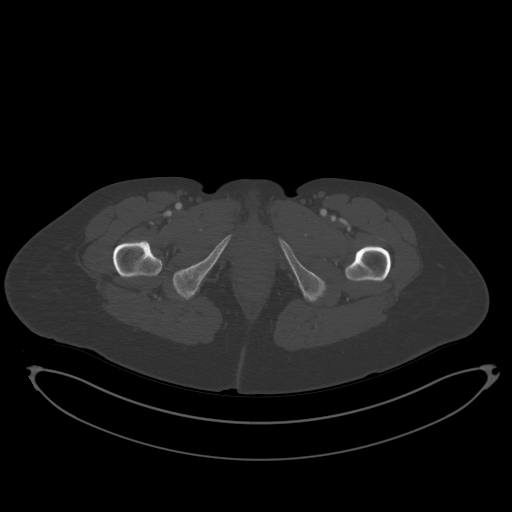
[im 32/202  soft-tissue]
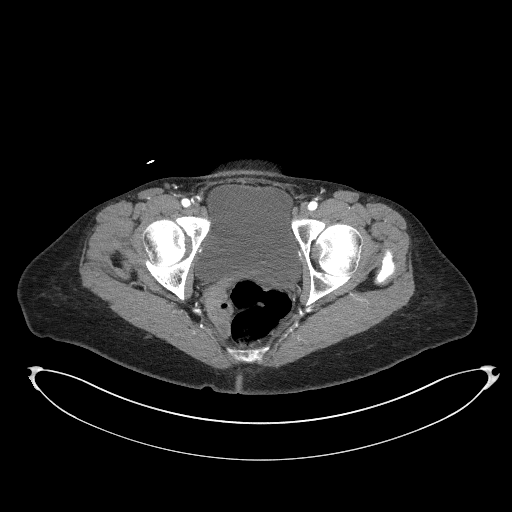
[im 53/202  soft-tissue]
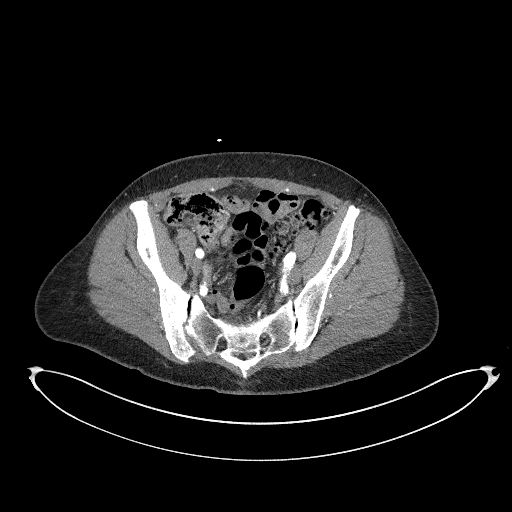
[im 75/202  soft-tissue]
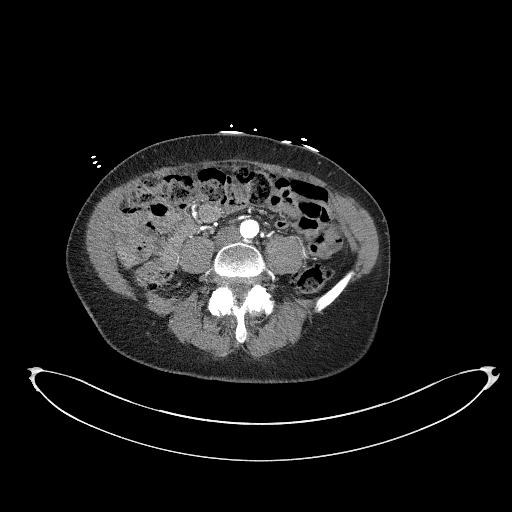
[im 96/202  soft-tissue]
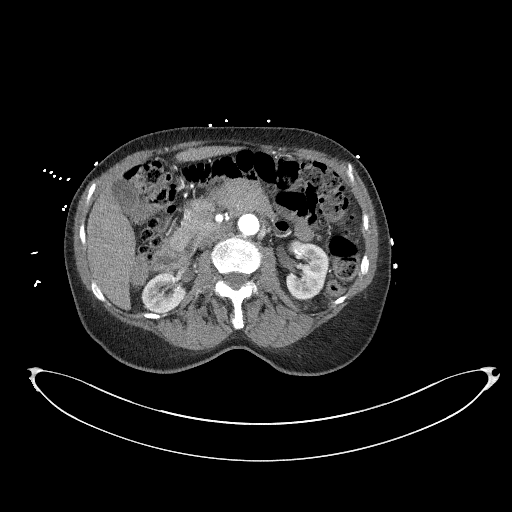
[im 106/202  soft-tissue]
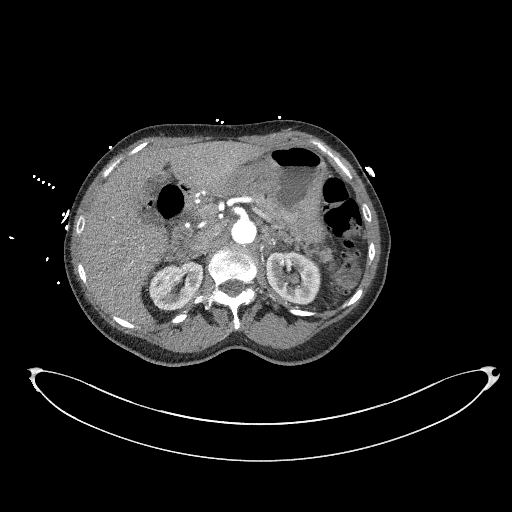
[im 127/202  soft-tissue]
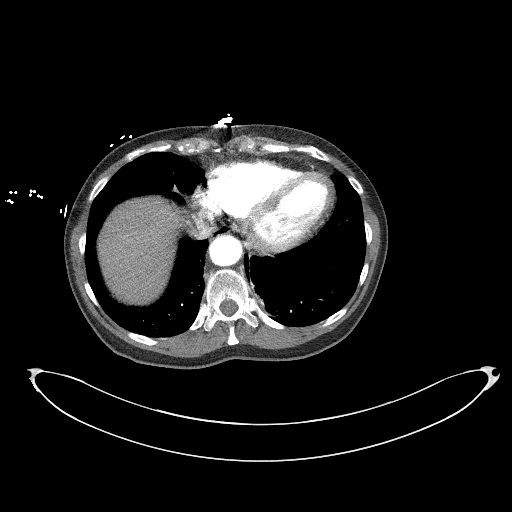
[im 149/202  soft-tissue]
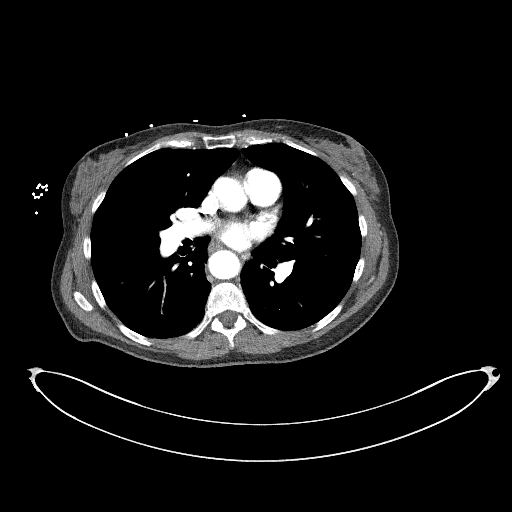
[im 170/202  soft-tissue]
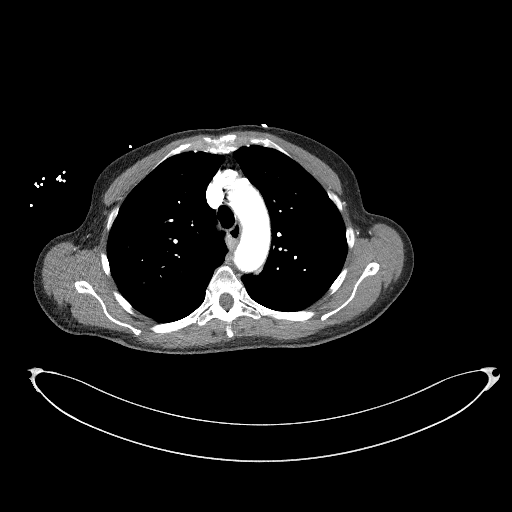
[im 170/202  bone]
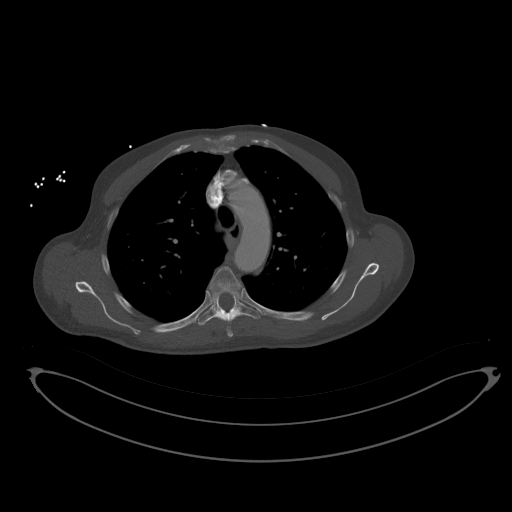
[im 191/202  soft-tissue]
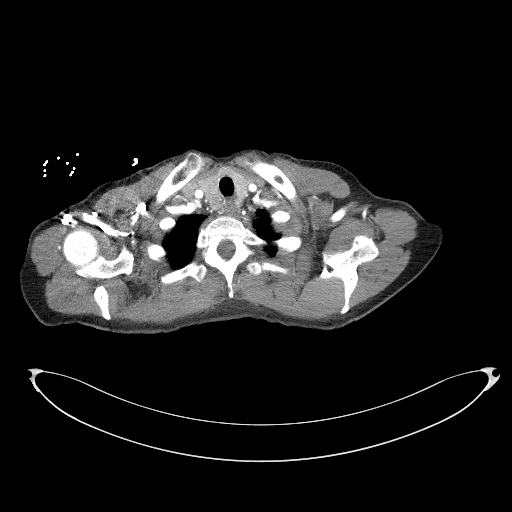

[Series 9: coronals · coronal · 0.79mm/px · 3 of 132 slices shown]
[im 33/132  soft-tissue]
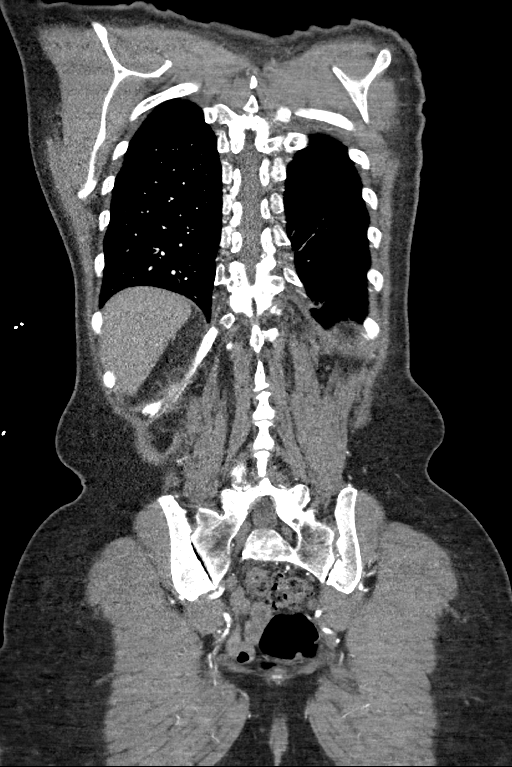
[im 66/132  soft-tissue]
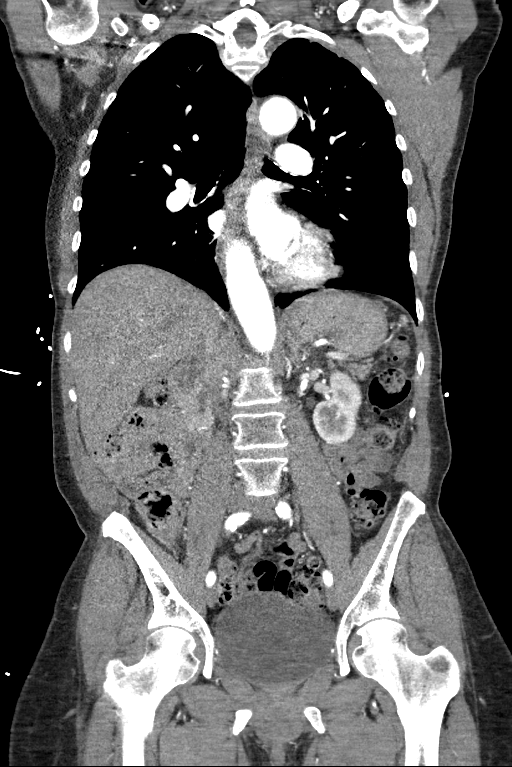
[im 99/132  soft-tissue]
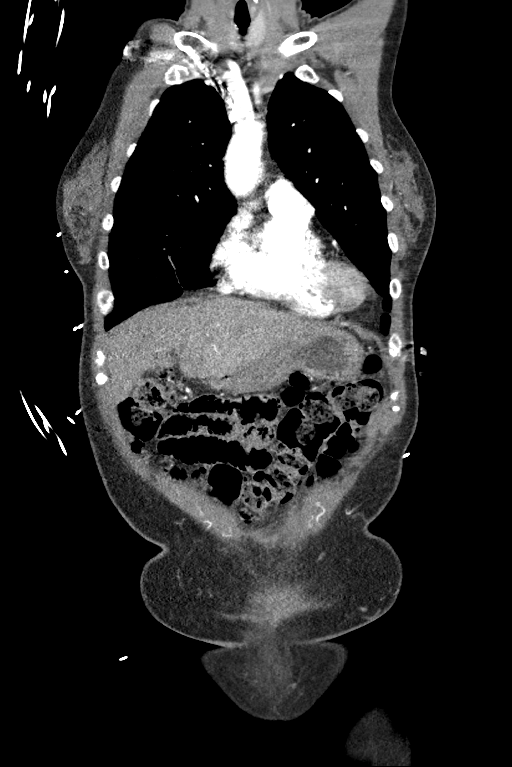

[13 of 46 positions shown; findings below may reference images not displayed]

Multidetector CT imaging through the chest, abdomen and pelvis was
performed using the standard protocol during bolus administration of
intravenous contrast. Multiplanar reconstructed images and MIPs were
obtained and reviewed to evaluate the vascular anatomy.

RADIATION DOSE REDUCTION: This exam was performed according to the
departmental dose-optimization program which includes automated
exposure control, adjustment of the mA and/or kV according to
patient size and/or use of iterative reconstruction technique.

CONTRAST:  100mL OMNIPAQUE IOHEXOL 350 MG/ML SOLN
FINDINGS: CTA CHEST FINDINGS

Cardiovascular: There is ectasia of the thoracic aorta without
aneurysm or dissection. Atherosclerosis of the aortic arch and
proximal descending thoracic aorta.

The heart is unremarkable. Stable atherosclerosis within the LAD
distribution of the coronary vasculature. There is technically
adequate opacification of the pulmonary vasculature. No filling
defects or pulmonary emboli.

Mediastinum/Nodes: No enlarged mediastinal, hilar, or axillary lymph
nodes. Thyroid gland, trachea, and esophagus demonstrate no
significant findings.

Lungs/Pleura: Upper lobe predominant emphysema. Linear opacities
within the right middle and bilateral lower lobes consistent with
chronic scarring. No acute airspace disease, effusion, or
pneumothorax. Subpleural curvilinear density within the right lower
lobe image 59/6 likely reflects postinflammatory scarring.

Musculoskeletal: No acute or destructive bony lesions. Reconstructed
images demonstrate no additional findings.

Review of the MIP images confirms the above findings.

CTA ABDOMEN AND PELVIS FINDINGS

VASCULAR

Aorta: Normal caliber aorta without aneurysm, dissection, vasculitis
or significant stenosis. Diffuse atherosclerosis.

Celiac: Patent without evidence of aneurysm, dissection, vasculitis
or significant stenosis. Mild atherosclerosis at the origin.

SMA: Patent without evidence of aneurysm, dissection, vasculitis or
significant stenosis. Replaced right hepatic artery off the SMA, a
frequent anatomic variant.

Renals: There is stable atherosclerosis at the origin of the
bilateral renal arteries, estimated less than 50% stenosis on the
right and 50-70% stenosis on the left. No aneurysm, dissection,
vasculitis, or evidence of fibromuscular dysplasia.

IMA: Patent without evidence of aneurysm, dissection, vasculitis or
significant stenosis.

Inflow: Patent without evidence of aneurysm, dissection, vasculitis
or significant stenosis. Diffuse atherosclerosis.

Veins: No obvious venous abnormality within the limitations of this
arterial phase study.

Review of the MIP images confirms the above findings.

NON-VASCULAR

Hepatobiliary: No focal liver abnormality is seen. No gallstones,
gallbladder wall thickening, or biliary dilatation.

Pancreas: Unremarkable. No pancreatic ductal dilatation or
surrounding inflammatory changes.

Spleen: Normal in size without focal abnormality.

Adrenals/Urinary Tract: Stable left renal cyst. No imaging follow-up
is required. Otherwise the kidneys enhance normally and
symmetrically. No urinary tract calculi or obstructive uropathy. The
adrenals are grossly normal.

Stomach/Bowel: No bowel obstruction or ileus. Normal appendix right
lower quadrant. Diffuse diverticulosis of the descending and sigmoid
colon without diverticulitis. No bowel wall thickening or
inflammatory change.

Lymphatic: No pathologic adenopathy within the abdomen or pelvis.

Reproductive: Status post hysterectomy. No adnexal masses.

Other: No free fluid or free intraperitoneal gas. No abdominal wall
hernia.

Musculoskeletal: No acute or destructive bony lesions. Trabecular
and cortical thickening throughout the right iliac bone most
consistent with Paget disease, stable. Reconstructed images
demonstrate no additional findings.

Review of the MIP images confirms the above findings.
IMPRESSION: 1. No evidence of thoracic aortic aneurysm or dissection.
2. No evidence of pulmonary embolus.
3. Prominent atherosclerosis of the renal arteries, with less than
50% stenosis on the right and 50-70% stenosis on the left.
4. Aortic Atherosclerosis (HJR71-A94.4) and Emphysema (HJR71-OIO.C).
5. Likely area of postinflammatory subpleural scarring in the right
lower lobe, at the site of previous airspace disease. No acute
intrathoracic process.
6. Distal colonic diverticulosis without diverticulitis.
7. Trabecular and cortical thickening throughout the right iliac
bone, unchanged, most consistent with Paget disease.
8. Stable coronary artery atherosclerosis.
